# Patient Record
Sex: Female | Born: 1960 | Race: White | Hispanic: No
Health system: Southern US, Community
[De-identification: ages and names within clinical notes are randomized; demographics above are authoritative.]

## PROBLEM LIST (undated history)

## (undated) DIAGNOSIS — E119 Type 2 diabetes mellitus without complications: Secondary | ICD-10-CM

## (undated) DIAGNOSIS — I10 Essential (primary) hypertension: Secondary | ICD-10-CM

## (undated) DIAGNOSIS — J3489 Other specified disorders of nose and nasal sinuses: Secondary | ICD-10-CM

## (undated) DIAGNOSIS — R51 Headache: Secondary | ICD-10-CM

## (undated) DIAGNOSIS — M1990 Unspecified osteoarthritis, unspecified site: Secondary | ICD-10-CM

## (undated) DIAGNOSIS — E7800 Pure hypercholesterolemia, unspecified: Secondary | ICD-10-CM

## (undated) DIAGNOSIS — F32A Depression, unspecified: Secondary | ICD-10-CM

## (undated) HISTORY — DX: Depression, unspecified: F32.A

## (undated) HISTORY — PX: CARPAL TUNNEL RELEASE: SHX101

## (undated) HISTORY — PX: TONSILLECTOMY: SUR1361

## (undated) HISTORY — PX: BREAST SURGERY: SHX581

## (undated) HISTORY — DX: Pure hypercholesterolemia, unspecified: E78.00

## (undated) HISTORY — PX: TUBAL LIGATION: SHX77

## (undated) HISTORY — PX: FOOT SURGERY: SHX648

## (undated) HISTORY — PX: WISDOM TOOTH EXTRACTION: SHX21

---

## 1997-07-19 ENCOUNTER — Ambulatory Visit (HOSPITAL_COMMUNITY): Admission: RE | Admit: 1997-07-19 | Discharge: 1997-07-19 | Payer: Self-pay | Admitting: *Deleted

## 1997-07-27 ENCOUNTER — Ambulatory Visit (HOSPITAL_BASED_OUTPATIENT_CLINIC_OR_DEPARTMENT_OTHER): Admission: RE | Admit: 1997-07-27 | Discharge: 1997-07-27 | Payer: Self-pay | Admitting: *Deleted

## 1997-11-01 ENCOUNTER — Ambulatory Visit (HOSPITAL_COMMUNITY): Admission: RE | Admit: 1997-11-01 | Discharge: 1997-11-01 | Payer: Self-pay | Admitting: Orthopedic Surgery

## 2000-07-10 ENCOUNTER — Other Ambulatory Visit: Admission: RE | Admit: 2000-07-10 | Discharge: 2000-07-10 | Payer: Self-pay | Admitting: Internal Medicine

## 2000-07-14 ENCOUNTER — Encounter: Payer: Self-pay | Admitting: Internal Medicine

## 2000-07-14 ENCOUNTER — Encounter: Admission: RE | Admit: 2000-07-14 | Discharge: 2000-07-14 | Payer: Self-pay | Admitting: Internal Medicine

## 2004-11-12 ENCOUNTER — Encounter: Admission: RE | Admit: 2004-11-12 | Discharge: 2004-11-12 | Payer: Self-pay | Admitting: Internal Medicine

## 2010-05-06 ENCOUNTER — Encounter: Payer: Self-pay | Admitting: Family Medicine

## 2011-09-10 ENCOUNTER — Other Ambulatory Visit: Payer: Self-pay | Admitting: Family Medicine

## 2011-09-10 DIAGNOSIS — Z1231 Encounter for screening mammogram for malignant neoplasm of breast: Secondary | ICD-10-CM

## 2011-09-24 ENCOUNTER — Ambulatory Visit
Admission: RE | Admit: 2011-09-24 | Discharge: 2011-09-24 | Disposition: A | Discharge status: Home / Self Care | Payer: BC Managed Care – PPO | Source: Ambulatory Visit | Attending: Family Medicine | Admitting: Family Medicine

## 2011-09-24 DIAGNOSIS — Z1231 Encounter for screening mammogram for malignant neoplasm of breast: Secondary | ICD-10-CM

## 2013-02-11 ENCOUNTER — Other Ambulatory Visit: Payer: Self-pay | Admitting: Family Medicine

## 2013-02-11 ENCOUNTER — Ambulatory Visit
Admission: RE | Admit: 2013-02-11 | Discharge: 2013-02-11 | Disposition: A | Discharge status: Home / Self Care | Payer: BC Managed Care – PPO | Source: Ambulatory Visit | Attending: Family Medicine | Admitting: Family Medicine

## 2013-02-11 DIAGNOSIS — R52 Pain, unspecified: Secondary | ICD-10-CM

## 2013-09-21 ENCOUNTER — Other Ambulatory Visit: Payer: Self-pay | Admitting: Orthopedic Surgery

## 2013-09-23 ENCOUNTER — Encounter (HOSPITAL_COMMUNITY): Payer: Self-pay | Admitting: Pharmacy Technician

## 2013-09-28 ENCOUNTER — Encounter (HOSPITAL_COMMUNITY): Payer: Self-pay

## 2013-09-28 ENCOUNTER — Ambulatory Visit (HOSPITAL_COMMUNITY)
Admission: RE | Admit: 2013-09-28 | Discharge: 2013-09-28 | Disposition: A | Discharge status: Home / Self Care | Payer: Worker's Compensation | Source: Ambulatory Visit | Attending: Orthopedic Surgery | Admitting: Orthopedic Surgery

## 2013-09-28 ENCOUNTER — Encounter (HOSPITAL_COMMUNITY)
Admission: RE | Admit: 2013-09-28 | Discharge: 2013-09-28 | Disposition: A | Discharge status: Home / Self Care | Payer: Worker's Compensation | Source: Ambulatory Visit | Attending: Orthopedic Surgery | Admitting: Orthopedic Surgery

## 2013-09-28 DIAGNOSIS — Z01818 Encounter for other preprocedural examination: Secondary | ICD-10-CM | POA: Insufficient documentation

## 2013-09-28 DIAGNOSIS — Z01812 Encounter for preprocedural laboratory examination: Secondary | ICD-10-CM | POA: Insufficient documentation

## 2013-09-28 DIAGNOSIS — Z01810 Encounter for preprocedural cardiovascular examination: Secondary | ICD-10-CM | POA: Insufficient documentation

## 2013-09-28 HISTORY — DX: Unspecified osteoarthritis, unspecified site: M19.90

## 2013-09-28 HISTORY — DX: Headache: R51

## 2013-09-28 HISTORY — DX: Other specified disorders of nose and nasal sinuses: J34.89

## 2013-09-28 HISTORY — DX: Essential (primary) hypertension: I10

## 2013-09-28 HISTORY — DX: Type 2 diabetes mellitus without complications: E11.9

## 2013-09-28 LAB — URINALYSIS, ROUTINE W REFLEX MICROSCOPIC
BILIRUBIN URINE: NEGATIVE
GLUCOSE, UA: NEGATIVE mg/dL
HGB URINE DIPSTICK: NEGATIVE
Ketones, ur: 15 mg/dL — AB
Leukocytes, UA: NEGATIVE
Nitrite: NEGATIVE
PROTEIN: NEGATIVE mg/dL
Specific Gravity, Urine: 1.022 (ref 1.005–1.030)
UROBILINOGEN UA: 0.2 mg/dL (ref 0.0–1.0)
pH: 6.5 (ref 5.0–8.0)

## 2013-09-28 LAB — COMPREHENSIVE METABOLIC PANEL
ALT: 22 U/L (ref 0–35)
AST: 13 U/L (ref 0–37)
Albumin: 4.2 g/dL (ref 3.5–5.2)
Alkaline Phosphatase: 44 U/L (ref 39–117)
BUN: 19 mg/dL (ref 6–23)
CHLORIDE: 102 meq/L (ref 96–112)
CO2: 29 meq/L (ref 19–32)
Calcium: 9.6 mg/dL (ref 8.4–10.5)
Creatinine, Ser: 0.64 mg/dL (ref 0.50–1.10)
GFR calc Af Amer: >90 mL/min (ref >90)
Glucose, Bld: 167 mg/dL — ABNORMAL HIGH (ref 70–99)
Potassium: 4.2 mEq/L (ref 3.7–5.3)
SODIUM: 142 meq/L (ref 137–147)
Total Bilirubin: 0.4 mg/dL (ref 0.3–1.2)
Total Protein: 7.3 g/dL (ref 6.0–8.3)

## 2013-09-28 LAB — CBC WITH DIFFERENTIAL/PLATELET
Basophils Absolute: 0 10*3/uL (ref 0.0–0.1)
Basophils Relative: 0 % (ref 0–1)
Eosinophils Absolute: 0 10*3/uL (ref 0.0–0.7)
Eosinophils Relative: 1 % (ref 0–5)
HEMATOCRIT: 40.2 % (ref 36.0–46.0)
Hemoglobin: 13.4 g/dL (ref 12.0–15.0)
LYMPHS PCT: 32 % (ref 12–46)
Lymphs Abs: 2.5 10*3/uL (ref 0.7–4.0)
MCH: 29.6 pg (ref 26.0–34.0)
MCHC: 33.3 g/dL (ref 30.0–36.0)
MCV: 88.9 fL (ref 78.0–100.0)
MONO ABS: 0.3 10*3/uL (ref 0.1–1.0)
Monocytes Relative: 4 % (ref 3–12)
NEUTROS ABS: 4.9 10*3/uL (ref 1.7–7.7)
Neutrophils Relative %: 63 % (ref 43–77)
PLATELETS: 230 10*3/uL (ref 150–400)
RBC: 4.52 MIL/uL (ref 3.87–5.11)
RDW: 13.1 % (ref 11.5–15.5)
WBC: 7.8 10*3/uL (ref 4.0–10.5)

## 2013-09-28 LAB — TYPE AND SCREEN
ABO/RH(D): A POS
ANTIBODY SCREEN: NEGATIVE

## 2013-09-28 LAB — PROTIME-INR
INR: 0.97 (ref 0.00–1.49)
Prothrombin Time: 12.7 seconds (ref 11.6–15.2)

## 2013-09-28 LAB — APTT: APTT: 29 s (ref 24–37)

## 2013-09-28 LAB — ABO/RH: ABO/RH(D): A POS

## 2013-09-28 LAB — SURGICAL PCR SCREEN
MRSA, PCR: NEGATIVE
Staphylococcus aureus: NEGATIVE

## 2013-09-28 NOTE — Pre-Procedure Instructions (Signed)
Shaliah Gus HeightW Shanker  09/28/2013   Your procedure is scheduled on:  Thursday June 18 th   [facility] at 1015 AM.  Call this number if you have problems the morning of surgery: [phone]   Remember:   Do not eat food or drink liquids after midnight.   Take these medicines the morning of surgery with A SIP OF WATER: None Stop Herbal medications , Nsaids , Aspirin and vitamins 5 days prior to surgery.   Do not wear jewelry, make-up or nail polish.  Do not wear lotions, powders, or perfumes. You may wear deodorant.  Do not shave 48 hours prior to surgery.   Do not bring valuables to the hospital.  [facility] any belongings or valuables.               Contacts, dentures or bridgework may not be worn into surgery.  Leave suitcase in the car. After surgery it may be brought to your room.  For patients admitted to the hospital, discharge time is determined by your treatment team.               Patients discharged the day of surgery will not be allowed to drive home.    Special Instructions: [place] - Preparing for Surgery  Before surgery, you can play an important role.  Because skin is not sterile, your skin needs to be as free of germs as possible.  You can reduce the number of germs on you skin by washing with CHG (chlorahexidine gluconate) soap before surgery.  CHG is an antiseptic cleaner which kills germs and bonds with the skin to continue killing germs even after washing.  Please DO NOT use if you have an allergy to CHG or antibacterial soaps.  If your skin becomes reddened/irritated stop using the CHG and inform your nurse when you arrive at Short Stay.  Do not shave (including legs and underarms) for at least 48 hours prior to the first CHG shower.  You may shave your face.  Please follow these instructions carefully:   1.  Shower with CHG Soap the night before surgery and the                                morning of  Surgery.  2.  If you choose to wash your hair, wash your hair first as usual with your       normal shampoo.  3.  After you shampoo, rinse your hair and body thoroughly to remove the                      Shampoo.  4.  Use CHG as you would any other liquid soap.  You can apply chg directly       to the skin and wash gently with scrungie or a clean washcloth.  5.  Apply the CHG Soap to your body ONLY FROM THE NECK DOWN.        Do not use on open wounds or open sores.  Avoid contact with your eyes,       ears, mouth and genitals (private parts).  Wash genitals (private parts)       with your normal soap.  6.  Wash thoroughly, paying special attention to the area where your surgery        will be performed.  7.  Thoroughly rinse your body with warm water from the neck down.  8.  DO NOT shower/wash with your normal soap after using and rinsing off       the CHG Soap.  9.  Pat yourself dry with a clean towel.            10.  Wear clean pajamas.            11.  Place clean sheets on your bed the night of your first shower and do not        sleep with pets.  Day of Surgery  Do not apply any lotions/deoderants the morning of surgery.  Please wear clean clothes to the hospital/surgery center.      Please read over the following fact sheets that you were given: Pain Booklet, Coughing and Deep Breathing, Blood Transfusion Information, MRSA Information and Surgical Site Infection Prevention

## 2013-09-29 MED ORDER — CEFAZOLIN SODIUM-DEXTROSE 2-3 GM-% IV SOLR
2.0000 g | INTRAVENOUS | Status: AC
  Administered 2013-09-30: 2 g via INTRAVENOUS
  Filled 2013-09-29: qty 50

## 2013-09-29 MED ORDER — POVIDONE-IODINE 7.5 % EX SOLN
Freq: Once | CUTANEOUS | Status: DC
  Filled 2013-09-29: qty 118

## 2013-09-29 NOTE — H&P (Signed)
     PREOPERATIVE H&P  Chief Complaint: right arm pain  HPI: Erica Turner is a 53 y.o. female who presents with ongoing pain in the right arm  MRI reveals a foraminal disc herniation on the right at C6/7  Patient has failed multiple forms of conservative care and continues to have pain (see office notes for additional details regarding the patient's full course of treatment)  Past Medical History  Diagnosis Date  . Diabetes mellitus without complication     takes  cinnamion and eat lots of proteins   . Hypertension     on meds  . Sinus drainage   . Headache(784.0)     migraines  . Arthritis     neck and hip   Past Surgical History  Procedure Laterality Date  . Tonsillectomy    . Carpal tunnel release Bilateral   . Breast surgery Right     lumpectomy  . Tubal ligation     History   Social History  . Marital Status: Married    Spouse Name: N/A    Number of Children: N/A  . Years of Education: N/A   Social History Main Topics  . Smoking status: Never Smoker   . Smokeless tobacco: Never Used  . Alcohol Use: Yes     Comment: wine rarely  . Drug Use: No  . Sexual Activity: Not on file   Other Topics Concern  . Not on file   Social History Narrative  . No narrative on file   No family history on file. No Known Allergies Prior to Admission medications   Medication Sig Start Date End Date Taking? Authorizing Provider  acetaminophen (TYLENOL) 500 MG tablet Take 1,000 mg by mouth every 8 (eight) hours as needed (pain).   Yes Historical Provider, MD  CINNAMON PO Take 1 tablet by mouth 2 (two) times daily.   Yes Historical Provider, MD  lisinopril (PRINIVIL,ZESTRIL) 10 MG tablet Take 10 mg by mouth daily.   Yes Historical Provider, MD  rosuvastatin (CRESTOR) 20 MG tablet Take 20 mg by mouth daily.   Yes Historical Provider, MD     All other systems have been reviewed and were otherwise negative with the exception of those mentioned in the HPI and as  above.  Physical Exam: There were no vitals filed for this visit.  General: Alert, no acute distress Cardiovascular: No pedal edema Respiratory: No cyanosis, no use of accessory musculature Skin: No lesions in the area of chief complaint Neurologic: Sensation intact distally Psychiatric: Patient is competent for consent with normal mood and affect Lymphatic: No axillary or cervical lymphadenopathy  MUSCULOSKELETAL: + spurling's on right  Assessment/Plan: Right arm pain Plan for Procedure(s): ANTERIOR CERVICAL DECOMPRESSION/FUSION C6/7   Emilee HeroUMONSKI,MARK LEONARD, MD 09/29/2013 2:19 PM

## 2013-09-30 ENCOUNTER — Encounter (HOSPITAL_COMMUNITY): Payer: Self-pay | Admitting: *Deleted

## 2013-09-30 ENCOUNTER — Encounter (HOSPITAL_COMMUNITY)
Admission: RE | Disposition: A | Discharge status: Home / Self Care | Payer: Self-pay | Source: Ambulatory Visit | Attending: Orthopedic Surgery

## 2013-09-30 ENCOUNTER — Ambulatory Visit (HOSPITAL_COMMUNITY): Payer: Worker's Compensation | Admitting: Anesthesiology

## 2013-09-30 ENCOUNTER — Encounter (HOSPITAL_COMMUNITY): Payer: Worker's Compensation | Admitting: Anesthesiology

## 2013-09-30 ENCOUNTER — Ambulatory Visit (HOSPITAL_COMMUNITY): Payer: Worker's Compensation

## 2013-09-30 ENCOUNTER — Observation Stay (HOSPITAL_COMMUNITY)
Admission: RE | Admit: 2013-09-30 | Discharge: 2013-10-01 | Disposition: A | Discharge status: Home / Self Care | Payer: Worker's Compensation | Source: Ambulatory Visit | Attending: Orthopedic Surgery | Admitting: Orthopedic Surgery

## 2013-09-30 DIAGNOSIS — M5410 Radiculopathy, site unspecified: Secondary | ICD-10-CM | POA: Diagnosis present

## 2013-09-30 DIAGNOSIS — M1610 Unilateral primary osteoarthritis, unspecified hip: Secondary | ICD-10-CM | POA: Insufficient documentation

## 2013-09-30 DIAGNOSIS — G43909 Migraine, unspecified, not intractable, without status migrainosus: Secondary | ICD-10-CM | POA: Insufficient documentation

## 2013-09-30 DIAGNOSIS — I10 Essential (primary) hypertension: Secondary | ICD-10-CM | POA: Insufficient documentation

## 2013-09-30 DIAGNOSIS — E119 Type 2 diabetes mellitus without complications: Secondary | ICD-10-CM | POA: Insufficient documentation

## 2013-09-30 DIAGNOSIS — M5020 Other cervical disc displacement, unspecified cervical region: Principal | ICD-10-CM | POA: Insufficient documentation

## 2013-09-30 HISTORY — PX: ANTERIOR CERVICAL DECOMP/DISCECTOMY FUSION: SHX1161

## 2013-09-30 LAB — GLUCOSE, CAPILLARY
GLUCOSE-CAPILLARY: 121 mg/dL — AB (ref 70–99)
Glucose-Capillary: 101 mg/dL — ABNORMAL HIGH (ref 70–99)
Glucose-Capillary: 106 mg/dL — ABNORMAL HIGH (ref 70–99)

## 2013-09-30 SURGERY — ANTERIOR CERVICAL DECOMPRESSION/DISCECTOMY FUSION 1 LEVEL
Anesthesia: General | Site: Spine Cervical

## 2013-09-30 MED ORDER — THROMBIN 20000 UNITS EX SOLR
CUTANEOUS | Status: DC | PRN
  Administered 2013-09-30: 15:00:00 via TOPICAL

## 2013-09-30 MED ORDER — OXYCODONE-ACETAMINOPHEN 5-325 MG PO TABS: 1.0000 | ORAL_TABLET | ORAL | Status: DC | PRN

## 2013-09-30 MED ORDER — ONDANSETRON HCL 4 MG/2ML IJ SOLN: 4.0000 mg | INTRAMUSCULAR | Status: DC | PRN

## 2013-09-30 MED ORDER — BUPIVACAINE-EPINEPHRINE (PF) 0.25% -1:200000 IJ SOLN
INTRAMUSCULAR | Status: AC
  Filled 2013-09-30: qty 30

## 2013-09-30 MED ORDER — DOCUSATE SODIUM 100 MG PO CAPS
100.0000 mg | ORAL_CAPSULE | Freq: Two times a day (BID) | ORAL | Status: DC
  Administered 2013-09-30: 100 mg via ORAL
  Filled 2013-09-30 (×3): qty 1

## 2013-09-30 MED ORDER — HYDROMORPHONE HCL PF 1 MG/ML IJ SOLN
INTRAMUSCULAR | Status: AC
  Administered 2013-09-30: 0.5 mg via INTRAVENOUS
  Filled 2013-09-30: qty 1

## 2013-09-30 MED ORDER — HYDROMORPHONE HCL PF 1 MG/ML IJ SOLN
0.2500 mg | INTRAMUSCULAR | Status: DC | PRN
  Administered 2013-09-30 (×2): 0.5 mg via INTRAVENOUS

## 2013-09-30 MED ORDER — SODIUM CHLORIDE 0.9 % IJ SOLN: 3.0000 mL | INTRAMUSCULAR | Status: DC | PRN

## 2013-09-30 MED ORDER — CEFAZOLIN SODIUM 1-5 GM-% IV SOLN
1.0000 g | Freq: Three times a day (TID) | INTRAVENOUS | Status: AC
  Administered 2013-09-30 – 2013-10-01 (×2): 1 g via INTRAVENOUS
  Filled 2013-09-30 (×2): qty 50

## 2013-09-30 MED ORDER — SENNOSIDES-DOCUSATE SODIUM 8.6-50 MG PO TABS
1.0000 | ORAL_TABLET | Freq: Every evening | ORAL | Status: DC | PRN
  Filled 2013-09-30: qty 1

## 2013-09-30 MED ORDER — ROCURONIUM BROMIDE 100 MG/10ML IV SOLN
INTRAVENOUS | Status: DC | PRN
  Administered 2013-09-30: 50 mg via INTRAVENOUS
  Administered 2013-09-30: 10 mg via INTRAVENOUS

## 2013-09-30 MED ORDER — FLEET ENEMA 7-19 GM/118ML RE ENEM
1.0000 | ENEMA | Freq: Once | RECTAL | Status: AC | PRN
  Filled 2013-09-30: qty 1

## 2013-09-30 MED ORDER — OXYCODONE HCL 5 MG PO TABS: 5.0000 mg | ORAL_TABLET | Freq: Once | ORAL | Status: DC | PRN

## 2013-09-30 MED ORDER — BUPIVACAINE-EPINEPHRINE 0.25% -1:200000 IJ SOLN
INTRAMUSCULAR | Status: DC | PRN
  Administered 2013-09-30: 2 mL

## 2013-09-30 MED ORDER — SUFENTANIL CITRATE 50 MCG/ML IV SOLN
INTRAVENOUS | Status: DC | PRN
  Administered 2013-09-30: 10 ug via INTRAVENOUS
  Administered 2013-09-30: 20 ug via INTRAVENOUS

## 2013-09-30 MED ORDER — MIDAZOLAM HCL 5 MG/5ML IJ SOLN
INTRAMUSCULAR | Status: DC | PRN
  Administered 2013-09-30: 2 mg via INTRAVENOUS

## 2013-09-30 MED ORDER — MORPHINE SULFATE 2 MG/ML IJ SOLN
1.0000 mg | INTRAMUSCULAR | Status: DC | PRN
  Administered 2013-09-30 – 2013-10-01 (×2): 2 mg via INTRAVENOUS
  Filled 2013-09-30 (×2): qty 1

## 2013-09-30 MED ORDER — DIAZEPAM 5 MG PO TABS: 5.0000 mg | ORAL_TABLET | Freq: Four times a day (QID) | ORAL | Status: DC | PRN

## 2013-09-30 MED ORDER — 0.9 % SODIUM CHLORIDE (POUR BTL) OPTIME
TOPICAL | Status: DC | PRN
  Administered 2013-09-30: 1000 mL

## 2013-09-30 MED ORDER — LIDOCAINE HCL (CARDIAC) 20 MG/ML IV SOLN
INTRAVENOUS | Status: DC | PRN
  Administered 2013-09-30: 40 mg via INTRAVENOUS

## 2013-09-30 MED ORDER — ACETAMINOPHEN 325 MG PO TABS
650.0000 mg | ORAL_TABLET | ORAL | Status: DC | PRN
Start: 2013-09-30 — End: 2013-10-01

## 2013-09-30 MED ORDER — ACETAMINOPHEN 650 MG RE SUPP: 650.0000 mg | RECTAL | Status: DC | PRN

## 2013-09-30 MED ORDER — BISACODYL 5 MG PO TBEC
5.0000 mg | DELAYED_RELEASE_TABLET | Freq: Every day | ORAL | Status: DC | PRN
  Filled 2013-09-30: qty 1

## 2013-09-30 MED ORDER — NEOSTIGMINE METHYLSULFATE 10 MG/10ML IV SOLN
INTRAVENOUS | Status: DC | PRN
  Administered 2013-09-30: 4 mg via INTRAVENOUS

## 2013-09-30 MED ORDER — ONDANSETRON HCL 4 MG/2ML IJ SOLN
INTRAMUSCULAR | Status: DC | PRN
  Administered 2013-09-30: 4 mg via INTRAVENOUS

## 2013-09-30 MED ORDER — THROMBIN 20000 UNITS EX SOLR
CUTANEOUS | Status: AC
  Filled 2013-09-30: qty 20000

## 2013-09-30 MED ORDER — EPHEDRINE SULFATE 50 MG/ML IJ SOLN
INTRAMUSCULAR | Status: DC | PRN
  Administered 2013-09-30: 10 mg via INTRAVENOUS

## 2013-09-30 MED ORDER — PROPOFOL 10 MG/ML IV BOLUS
INTRAVENOUS | Status: AC
  Filled 2013-09-30: qty 20

## 2013-09-30 MED ORDER — GLYCOPYRROLATE 0.2 MG/ML IJ SOLN
INTRAMUSCULAR | Status: DC | PRN
  Administered 2013-09-30: .7 mg via INTRAVENOUS

## 2013-09-30 MED ORDER — METOCLOPRAMIDE HCL 5 MG/ML IJ SOLN: 10.0000 mg | Freq: Once | INTRAMUSCULAR | Status: DC | PRN

## 2013-09-30 MED ORDER — SODIUM CHLORIDE 0.9 % IV SOLN: 250.0000 mL | INTRAVENOUS | Status: DC

## 2013-09-30 MED ORDER — OXYCODONE HCL 5 MG/5ML PO SOLN: 5.0000 mg | Freq: Once | ORAL | Status: DC | PRN

## 2013-09-30 MED ORDER — LACTATED RINGERS IV SOLN
INTRAVENOUS | Status: DC | PRN
  Administered 2013-09-30 (×2): via INTRAVENOUS

## 2013-09-30 MED ORDER — SUFENTANIL CITRATE 50 MCG/ML IV SOLN
INTRAVENOUS | Status: AC
  Filled 2013-09-30: qty 1

## 2013-09-30 MED ORDER — MENTHOL 3 MG MT LOZG
1.0000 | LOZENGE | OROMUCOSAL | Status: DC | PRN
  Administered 2013-10-01: 3 mg via ORAL
  Filled 2013-09-30: qty 9

## 2013-09-30 MED ORDER — ATORVASTATIN CALCIUM 40 MG PO TABS
40.0000 mg | ORAL_TABLET | Freq: Every day | ORAL | Status: DC
  Filled 2013-09-30: qty 1

## 2013-09-30 MED ORDER — PROPOFOL 10 MG/ML IV BOLUS
INTRAVENOUS | Status: DC | PRN
  Administered 2013-09-30: 150 mg via INTRAVENOUS

## 2013-09-30 MED ORDER — ALUM & MAG HYDROXIDE-SIMETH 200-200-20 MG/5ML PO SUSP: 30.0000 mL | Freq: Four times a day (QID) | ORAL | Status: DC | PRN

## 2013-09-30 MED ORDER — LACTATED RINGERS IV SOLN: INTRAVENOUS | Status: DC

## 2013-09-30 MED ORDER — SODIUM CHLORIDE 0.9 % IJ SOLN
3.0000 mL | Freq: Two times a day (BID) | INTRAMUSCULAR | Status: DC
  Administered 2013-09-30: 3 mL via INTRAVENOUS

## 2013-09-30 MED ORDER — LISINOPRIL 10 MG PO TABS
10.0000 mg | ORAL_TABLET | Freq: Every day | ORAL | Status: DC
  Administered 2013-09-30: 10 mg via ORAL
  Filled 2013-09-30 (×2): qty 1

## 2013-09-30 MED ORDER — HYDROMORPHONE HCL PF 1 MG/ML IJ SOLN
INTRAMUSCULAR | Status: AC
  Filled 2013-09-30: qty 1

## 2013-09-30 MED ORDER — MIDAZOLAM HCL 2 MG/2ML IJ SOLN
INTRAMUSCULAR | Status: AC
  Filled 2013-09-30: qty 2

## 2013-09-30 MED ORDER — PHENOL 1.4 % MT LIQD: 1.0000 | OROMUCOSAL | Status: DC | PRN

## 2013-09-30 SURGICAL SUPPLY — 74 items
BENZOIN TINCTURE PRP APPL 2/3 (GAUZE/BANDAGES/DRESSINGS) ×2 IMPLANT
BIT DRILL NEURO 2X3.1 SFT TUCH (MISCELLANEOUS) ×1 IMPLANT
BIT DRILL SKYLINE 12MM (BIT) ×1 IMPLANT
BLADE SURG 15 STRL LF DISP TIS (BLADE) ×1 IMPLANT
BLADE SURG 15 STRL SS (BLADE) ×1
BLADE SURG ROTATE 9660 (MISCELLANEOUS) ×2 IMPLANT
BUR MATCHSTICK NEURO 3.0 LAGG (BURR) IMPLANT
CANISTER SUCT 3000ML (MISCELLANEOUS) ×2 IMPLANT
CARTRIDGE OIL MAESTRO DRILL (MISCELLANEOUS) ×1 IMPLANT
CLSR STERI-STRIP ANTIMIC 1/2X4 (GAUZE/BANDAGES/DRESSINGS) ×2 IMPLANT
COLLAR CERV LO CONTOUR FIRM DE (SOFTGOODS) ×2 IMPLANT
CORDS BIPOLAR (ELECTRODE) ×2 IMPLANT
COVER SURGICAL LIGHT HANDLE (MISCELLANEOUS) ×2 IMPLANT
CRADLE DONUT ADULT HEAD (MISCELLANEOUS) ×2 IMPLANT
DIFFUSER DRILL AIR PNEUMATIC (MISCELLANEOUS) ×2 IMPLANT
DRAIN JACKSON RD 7FR 3/32 (WOUND CARE) IMPLANT
DRAPE C-ARM 42X72 X-RAY (DRAPES) ×2 IMPLANT
DRAPE POUCH INSTRU U-SHP 10X18 (DRAPES) ×2 IMPLANT
DRAPE SURG 17X23 STRL (DRAPES) ×6 IMPLANT
DRILL BIT SKYLINE 12MM (BIT) ×1
DRILL NEURO 2X3.1 SOFT TOUCH (MISCELLANEOUS) ×2
DURAPREP 26ML APPLICATOR (WOUND CARE) ×2 IMPLANT
ELECT COATED BLADE 2.86 ST (ELECTRODE) ×2 IMPLANT
ELECT REM PT RETURN 9FT ADLT (ELECTROSURGICAL) ×2
ELECTRODE REM PT RTRN 9FT ADLT (ELECTROSURGICAL) ×1 IMPLANT
EVACUATOR SILICONE 100CC (DRAIN) IMPLANT
GAUZE SPONGE 4X4 16PLY XRAY LF (GAUZE/BANDAGES/DRESSINGS) ×2 IMPLANT
GLOVE BIO SURGEON STRL SZ7 (GLOVE) ×2 IMPLANT
GLOVE BIO SURGEON STRL SZ8 (GLOVE) ×2 IMPLANT
GLOVE BIOGEL PI IND STRL 7.0 (GLOVE) ×2 IMPLANT
GLOVE BIOGEL PI IND STRL 8 (GLOVE) ×1 IMPLANT
GLOVE BIOGEL PI INDICATOR 7.0 (GLOVE) ×2
GLOVE BIOGEL PI INDICATOR 8 (GLOVE) ×1
GOWN STRL REUS W/ TWL LRG LVL3 (GOWN DISPOSABLE) ×1 IMPLANT
GOWN STRL REUS W/ TWL XL LVL3 (GOWN DISPOSABLE) ×1 IMPLANT
GOWN STRL REUS W/TWL LRG LVL3 (GOWN DISPOSABLE) ×1
GOWN STRL REUS W/TWL XL LVL3 (GOWN DISPOSABLE) ×1
IMPL S ENDOSKEL TC 7 ODEG (Orthopedic Implant) ×1 IMPLANT
IMPLANT S ENDOSKEL TC 7 ODEG (Orthopedic Implant) ×2 IMPLANT
IV CATH 14GX2 1/4 (CATHETERS) ×2 IMPLANT
KIT BASIN OR (CUSTOM PROCEDURE TRAY) ×2 IMPLANT
KIT ROOM TURNOVER OR (KITS) ×2 IMPLANT
MANIFOLD NEPTUNE II (INSTRUMENTS) IMPLANT
NEEDLE 27GAX1X1/2 (NEEDLE) ×2 IMPLANT
NEEDLE SPNL 20GX3.5 QUINCKE YW (NEEDLE) ×2 IMPLANT
NS IRRIG 1000ML POUR BTL (IV SOLUTION) ×2 IMPLANT
OIL CARTRIDGE MAESTRO DRILL (MISCELLANEOUS) ×2
PACK ORTHO CERVICAL (CUSTOM PROCEDURE TRAY) ×2 IMPLANT
PAD ARMBOARD 7.5X6 YLW CONV (MISCELLANEOUS) ×4 IMPLANT
PATTIES SURGICAL .5 X.5 (GAUZE/BANDAGES/DRESSINGS) IMPLANT
PATTIES SURGICAL .5 X1 (DISPOSABLE) IMPLANT
PIN DISTRACTION 14 (PIN) ×4 IMPLANT
PLATE SKYLINE 12MM (Plate) ×2 IMPLANT
PUTTY BONE DBX 2.5 MIS (Bone Implant) ×2 IMPLANT
SCREW VAR SELF TAP SKYLINE 14M (Screw) ×8 IMPLANT
SPONGE GAUZE 4X4 12PLY (GAUZE/BANDAGES/DRESSINGS) ×2 IMPLANT
SPONGE GAUZE 4X4 12PLY STER LF (GAUZE/BANDAGES/DRESSINGS) ×2 IMPLANT
SPONGE INTESTINAL PEANUT (DISPOSABLE) ×2 IMPLANT
SPONGE SURGIFOAM ABS GEL 100 (HEMOSTASIS) ×2 IMPLANT
STRIP CLOSURE SKIN 1/2X4 (GAUZE/BANDAGES/DRESSINGS) ×2 IMPLANT
SURGIFLO TRUKIT (HEMOSTASIS) IMPLANT
SUT MNCRL AB 4-0 PS2 18 (SUTURE) IMPLANT
SUT SILK 4 0 (SUTURE)
SUT SILK 4-0 18XBRD TIE 12 (SUTURE) IMPLANT
SUT VIC AB 2-0 CT2 18 VCP726D (SUTURE) ×2 IMPLANT
SYR BULB IRRIGATION 50ML (SYRINGE) ×2 IMPLANT
SYR CONTROL 10ML LL (SYRINGE) ×4 IMPLANT
TAPE CLOTH 4X10 WHT NS (GAUZE/BANDAGES/DRESSINGS) IMPLANT
TAPE CLOTH SURG 4X10 WHT LF (GAUZE/BANDAGES/DRESSINGS) ×2 IMPLANT
TAPE UMBILICAL COTTON 1/8X30 (MISCELLANEOUS) ×2 IMPLANT
TOWEL OR 17X24 6PK STRL BLUE (TOWEL DISPOSABLE) ×2 IMPLANT
TOWEL OR 17X26 10 PK STRL BLUE (TOWEL DISPOSABLE) ×2 IMPLANT
WATER STERILE IRR 1000ML POUR (IV SOLUTION) IMPLANT
YANKAUER SUCT BULB TIP NO VENT (SUCTIONS) ×2 IMPLANT

## 2013-09-30 NOTE — Anesthesia Postprocedure Evaluation (Signed)
  Anesthesia Post-op Note  Patient: Erica BealDanette W Turner  Procedure(s) Performed: Procedure(s) with comments: ANTERIOR CERVICAL DECOMPRESSION/DISCECTOMY FUSION 1 LEVEL (N/A) - Anterior cervical decompression fusion, cervical 6-7 with instrumentation and allograft  Patient Location: PACU  Anesthesia Type:General  Level of Consciousness: awake and alert   Airway and Oxygen Therapy: Patient Spontanous Breathing  Post-op Pain: mild  Post-op Assessment: Post-op Vital signs reviewed, Patient's Cardiovascular Status Stable, Respiratory Function Stable, Patent Airway and Pain level controlled  Post-op Vital Signs: Reviewed and stable  Last Vitals:  Filed Vitals:   09/30/13 1700  BP: 168/78  Pulse: 61  Temp:   Resp: 11    Complications: No apparent anesthesia complications

## 2013-09-30 NOTE — Anesthesia Procedure Notes (Signed)
Procedure Name: Intubation Date/Time: 09/30/2013 2:10 PM Performed by: Gwenyth AllegraADAMI, RICHARD Pre-anesthesia Checklist: Patient identified, Patient being monitored, Emergency Drugs available, Timeout performed and Suction available Patient Re-evaluated:Patient Re-evaluated prior to inductionOxygen Delivery Method: Circle system utilized Intubation Type: IV induction Ventilation: Mask ventilation without difficulty Laryngoscope Size: 3 and Mac Grade View: Grade I Tube type: Oral Number of attempts: 1 Airway Equipment and Method: Stylet Placement Confirmation: ETT inserted through vocal cords under direct vision and positive ETCO2 Secured at: 21 cm Tube secured with: Tape Dental Injury: Teeth and Oropharynx as per pre-operative assessment

## 2013-09-30 NOTE — Anesthesia Preprocedure Evaluation (Signed)
Anesthesia Evaluation  Patient identified by MRN, date of birth, ID band Patient awake    Reviewed: Allergy & Precautions, H&P , NPO status , Patient's Chart, lab work & pertinent test results, reviewed documented beta blocker date and time   Airway Mallampati: II TM Distance: >3 FB Neck ROM: full    Dental   Pulmonary neg pulmonary ROS,  breath sounds clear to auscultation        Cardiovascular hypertension, On Medications Rhythm:regular     Neuro/Psych  Headaches, negative psych ROS   GI/Hepatic negative GI ROS, Neg liver ROS,   Endo/Other  negative endocrine ROSdiabetes  Renal/GU negative Renal ROS  negative genitourinary   Musculoskeletal   Abdominal   Peds  Hematology negative hematology ROS (+)   Anesthesia Other Findings See surgeon's H&P   Reproductive/Obstetrics negative OB ROS                           Anesthesia Physical Anesthesia Plan  ASA: II  Anesthesia Plan: General   Post-op Pain Management:    Induction: Intravenous  Airway Management Planned: Oral ETT  Additional Equipment:   Intra-op Plan:   Post-operative Plan: Extubation in OR  Informed Consent: I have reviewed the patients History and Physical, chart, labs and discussed the procedure including the risks, benefits and alternatives for the proposed anesthesia with the patient or authorized representative who has indicated his/her understanding and acceptance.   Dental Advisory Given  Plan Discussed with: CRNA and Surgeon  Anesthesia Plan Comments:         Anesthesia Quick Evaluation

## 2013-09-30 NOTE — Transfer of Care (Signed)
Immediate Anesthesia Transfer of Care Note  Patient: Erica Turner  Procedure(s) Performed: Procedure(s) with comments: ANTERIOR CERVICAL DECOMPRESSION/DISCECTOMY FUSION 1 LEVEL (N/A) - Anterior cervical decompression fusion, cervical 6-7 with instrumentation and allograft  Patient Location: PACU  Anesthesia Type:General  Level of Consciousness: awake, alert  and oriented  Airway & Oxygen Therapy: Patient Spontanous Breathing and Patient connected to nasal cannula oxygen  Post-op Assessment: Report given to PACU RN and Post -op Vital signs reviewed and stable  Post vital signs: Reviewed and stable  Complications: No apparent anesthesia complications

## 2013-09-30 NOTE — Op Note (Signed)
NAMNickolas Madrid:  Janis, Jeffie              ACCOUNT NO.:  [id number]  MEDICAL RECORD NO.:  [id number]  LOCATION:  3C09C                        FACILITY:  [facility]  PHYSICIAN:  Estill BambergMark Dumonski, MD      DATE OF BIRTH:  1960/09/07  DATE OF PROCEDURE:  09/30/2013                              OPERATIVE REPORT   PREOPERATIVE DIAGNOSES: 1. Right C7 radiculopathy. 2. Right C6-7 disk herniation resulting in right C7 radiculopathy.  POSTOPERATIVE DIAGNOSES: 1. Right C7 radiculopathy. 2. Right C6-7 disk herniation resulting in right C7 radiculopathy.  PROCEDURE: 1. C6-7 anterior cervical decompression and fusion. 2. Placement of anterior instrumentation, C6-7. 3. Use of morselized allograft-DBX mix. 4. Intraoperative use of fluoroscopy. 5. Insertion of interbody device x1 (small, parallel, 7 mm tight and     interbody spacer).  SURGEON:  Estill BambergMark Dumonski, MD  ASSISTANT:  Jason CoopKayla McKenzie, PA-C  ANESTHESIA:  General endotracheal anesthesia.  COMPLICATIONS:  None.  DISPOSITION:  Stable.  ESTIMATED BLOOD LOSS:  Minimal.  INDICATIONS FOR PROCEDURE:  Briefly, Ms. Sherrie GeorgeDecker is a very pleasant 53- year-old female who did present to me on July 26, 2013, with severe pain in her right arm.  She is status post a work injury that did occur on March 21, 2013, when she was lifting a 50 pounds bag of potatoes. She went on to have significant pain in her neck and right arm.  An MRI did clearly reveal a C6-7 herniated disk fragment, clearly compressing the C7 nerve on the right.  The patient did fail conservative care, and we did discuss proceeding with the surgery reflected above.  The patient did fully understand the risks and limitations of the procedure as outlined in my preoperative note.  OPERATIVE DETAILS:  On September 30, 2013, the patient was brought to surgery and general endotracheal anesthesia was administered.  The patient was placed supine on a hospital bed.  All bony prominences were  meticulously padded.  The patient's arms were secured to her sides.  The neck was prepped and draped in the usual sterile fashion and a time-out procedure was performed.  I then made a left-sided transverse incision in line with the C6-7 interspace.  The platysma was sharply incised.  Clementeen GrahamSmith- Robinson approach was utilized.  The anterior spine was noted.  A lateral fluoroscopic view did confirm the appropriate operative level. I then subperiosteally exposed the vertebral bodies of C6 and C7.  A self-retaining Shadow-Line retractor was placed.  Caspar pins were placed into the C6 and C7 vertebral bodies and distraction was applied. I then used a 15-blade knife to perform an annulotomy anteriorly.  I then used a series of curettes to perform a thorough and complete diskectomy.  The posterior longitudinal ligament was identified and entered using a nerve hook.  I then used a series of Kerrison punches to remove the posterior longitudinal ligament.  A central and bilateral neural foraminal decompression was performed.  I did confirm adequate decompression of the neural foramen on the right side using a nerve hook.  The endplates were then prepared, and I did place a series of trials and I did ultimately select a 7 mm small parallel interbody spacer, which was packed with DBX  mix and tamped into position in the usual fashion.  The Caspar pins were then removed.  The bone wax was placed in their place.  I then applied a 12 mm plate over the anterior cervical spine.  A 14 mm variable angle screws were placed, 2 in each vertebral body at C6 and C7 for a total of 4 screws.  The CAM locking mechanism was utilized per the manufacture's recommendations.  I then copiously irrigated the wound.  I was very pleased with the AP and lateral fluoroscopic images.  The platysma was then closed using 2-0 Vicryl, and the skin was closed using 3-0 Monocryl.  Benzoin and Steri- Strips were applied, followed by a  sterile dressing.  All instrument counts were correct at the termination of the procedure.  Jason CoopKayla McKenzie was my assistant throughout surgery, and did aid in retraction, suctioning, and closure.     Estill BambergMark Dumonski, MD     MD/MEDQ  D:  09/30/2013  T:  09/30/2013  Job:  [id number]  cc:   Duncan Dullonna R. Gates, M.D.

## 2013-10-01 LAB — GLUCOSE, CAPILLARY: Glucose-Capillary: 138 mg/dL — ABNORMAL HIGH (ref 70–99)

## 2013-10-01 NOTE — Progress Notes (Signed)
Pt. Alert and oriented, follows simple instructions, denies pain. Incision area without swelling, redness or S/S of infection. Voiding adequate clear yellow urine. Moving all extremities well and vitals stable and documented. Anterior Cervical Fusion surgery notes instructions given to patient and family member for home safety and precautions. Patient discharged home with family. Pt. and family stated understanding of instructions given.   

## 2013-10-01 NOTE — Progress Notes (Signed)
    Patient doing well, reports resolution of R arm pain, some lingering intermittent numbness in R thumb. Moderate sore throat, felt as though she was having acid reflux this morning. Has been up and walking, eating breakfast now, expected mild neck pain and tightness    Physical Exam: Filed Vitals:   10/01/13 0807  BP: 115/64  Pulse: 68  Temp: 99 F (37.2 C)  Resp: 18    Dressing in place, soft collar worn appropriately, appears very comfortable sitting up in bed. NVI  POD #1 s/p C6-7 ACDF  - resolved R arm pain, minimal neck pain - Percocet for pain, Valium for muscle spasms, scripts signed in chart for D/C - likely d/c home today

## 2013-10-01 NOTE — Progress Notes (Signed)
UR Completed Brenda Graves-Bigelow, RN,BSN [phone]  

## 2013-10-03 ENCOUNTER — Encounter (HOSPITAL_COMMUNITY): Payer: Self-pay | Admitting: Orthopedic Surgery

## 2013-10-07 NOTE — Discharge Summary (Signed)
Patient ID: Erica Turner MRN: [id number] DOB/AGE: 55/07/1960 55 y.o.  Admit date: 09/30/2013 Discharge date: 10/01/2013  Admission Diagnoses:  Active Problems:   Radiculopathy   Discharge Diagnoses:  Same  Past Medical History  Diagnosis Date  . Hypertension     on meds  . Sinus drainage   . Headache(784.0)     migraines  . Arthritis     neck and hip  . Diabetes mellitus without complication     takes  cinnamion and eat lots of proteins     Surgeries: Procedure(s): ANTERIOR CERVICAL DECOMPRESSION/DISCECTOMY FUSION 1 LEVEL C6-7 on 09/30/2013   Consultants:  none  Discharged Condition: Improved  Hospital Course: Erica Turner is an 53 y.o. female who was admitted 09/30/2013 for operative treatment of right radiculopathy. Patient has severe unremitting pain that affects sleep, daily activities, and work/hobbies. After pre-op clearance the patient was taken to the operating room on 09/30/2013 and underwent  Procedure: C6-7 ACDF  Patient was given perioperative antibiotics:  Anti-infectives   Start     Dose/Rate Route Frequency Ordered Stop   09/30/13 2200  ceFAZolin (ANCEF) IVPB 1 g/50 mL premix     1 g 100 mL/hr over 30 Minutes Intravenous Every 8 hours 09/30/13 1938 10/01/13 0635   09/30/13 0600  ceFAZolin (ANCEF) IVPB 2 g/50 mL premix     2 g 100 mL/hr over 30 Minutes Intravenous On call to O.R. 09/29/13 1410 09/30/13 1355       Patient was given sequential compression devices, early ambulation to prevent DVT.  Patient benefited maximally from hospital stay and there were no complications.    Recent vital signs: BP 115/64  Pulse 68  Temp(Src) 99 F (37.2 C) (Oral)  Resp 18  Wt 69.485 kg (153 lb 3 oz)  SpO2 94%   Discharge Medications:     Medication List         acetaminophen 500 MG tablet  Commonly known as:  TYLENOL  Take 1,000 mg by mouth every 8 (eight) hours as needed (pain).     CINNAMON PO  Take 1 tablet by mouth 2 (two) times daily.      lisinopril 10 MG tablet  Commonly known as:  PRINIVIL,ZESTRIL  Take 10 mg by mouth daily.     rosuvastatin 20 MG tablet  Commonly known as:  CRESTOR  Take 20 mg by mouth daily.        Diagnostic Studies: Dg Chest 2 View  09/28/2013   CLINICAL DATA:  Preoperative evaluation for cervical spine surgery, history hypertension, diabetes  EXAM: CHEST  2 VIEW  COMPARISON:  None  FINDINGS: Upper normal heart size.  Normal mediastinal contours and pulmonary vascularity.  Lungs clear.  No pleural effusion or pneumothorax.  Bones unremarkable.  IMPRESSION: No acute abnormalities.   Electronically Signed   By: Ulyses SouthwardMark  Boles M.D.   On: 09/28/2013 15:59   Dg Cervical Spine 2-3 Views  10/01/2013   CLINICAL DATA:  Anterior cervical disc fusion.  EXAM: DG C-ARM 61-120 MIN; CERVICAL SPINE - 2-3 VIEW  COMPARISON:  MRI scan of April 21, 2013.  FINDINGS: Four intraoperative fluoroscopic images were obtained of the cervical spine. The first image demonstrates surgical probe directed toward the anterior portion of the C6-7 disc space. The second and third images demonstrate surgical needle directed toward the anterior portion of the C7 vertebral body. Fourth image demonstrates surgical probe directed into C6-7 disc space.  IMPRESSION: See above.   Electronically Signed  By: Roque LiasJames  Green M.D.   On: 10/01/2013 08:21   Dg C-arm 1-60 Min  10/01/2013   CLINICAL DATA:  Anterior cervical disc fusion.  EXAM: DG C-ARM 61-120 MIN; CERVICAL SPINE - 2-3 VIEW  COMPARISON:  MRI scan of April 21, 2013.  FINDINGS: Four intraoperative fluoroscopic images were obtained of the cervical spine. The first image demonstrates surgical probe directed toward the anterior portion of the C6-7 disc space. The second and third images demonstrate surgical needle directed toward the anterior portion of the C7 vertebral body. Fourth image demonstrates surgical probe directed into C6-7 disc space.  IMPRESSION: See above.   Electronically Signed    By: Roque LiasJames  Green M.D.   On: 10/01/2013 08:21    Disposition: 01-Home or Self Care   POD #1 s/p C6-7 ACDF  - resolved R arm pain, minimal neck pain  - Percocet for pain, Valium for muscle spasms, scripts       signed in chart for D/C  -D/C instructions sheet printed and in chart -D/C home today  -F/U in office 2 weeks   Signed: Georga BoraMCKENZIE, Sharod Petsch J 10/07/2013, 10:25 AM

## 2015-08-04 DIAGNOSIS — R202 Paresthesia of skin: Secondary | ICD-10-CM | POA: Diagnosis not present

## 2015-08-04 DIAGNOSIS — I10 Essential (primary) hypertension: Secondary | ICD-10-CM | POA: Diagnosis not present

## 2015-08-04 DIAGNOSIS — E119 Type 2 diabetes mellitus without complications: Secondary | ICD-10-CM | POA: Diagnosis not present

## 2015-08-09 DIAGNOSIS — E7800 Pure hypercholesterolemia, unspecified: Secondary | ICD-10-CM | POA: Diagnosis not present

## 2015-08-09 DIAGNOSIS — E119 Type 2 diabetes mellitus without complications: Secondary | ICD-10-CM | POA: Diagnosis not present

## 2015-08-09 DIAGNOSIS — I10 Essential (primary) hypertension: Secondary | ICD-10-CM | POA: Diagnosis not present

## 2015-08-09 DIAGNOSIS — Z7984 Long term (current) use of oral hypoglycemic drugs: Secondary | ICD-10-CM | POA: Diagnosis not present

## 2015-10-12 DIAGNOSIS — M7752 Other enthesopathy of left foot: Secondary | ICD-10-CM | POA: Diagnosis not present

## 2015-10-12 DIAGNOSIS — M659 Synovitis and tenosynovitis, unspecified: Secondary | ICD-10-CM | POA: Diagnosis not present

## 2015-10-12 DIAGNOSIS — M7670 Peroneal tendinitis, unspecified leg: Secondary | ICD-10-CM | POA: Diagnosis not present

## 2015-10-12 DIAGNOSIS — M7751 Other enthesopathy of right foot: Secondary | ICD-10-CM | POA: Diagnosis not present

## 2015-10-12 DIAGNOSIS — M19071 Primary osteoarthritis, right ankle and foot: Secondary | ICD-10-CM | POA: Diagnosis not present

## 2015-10-12 DIAGNOSIS — M19072 Primary osteoarthritis, left ankle and foot: Secondary | ICD-10-CM | POA: Diagnosis not present

## 2016-04-29 DIAGNOSIS — R51 Headache: Secondary | ICD-10-CM | POA: Diagnosis not present

## 2016-04-29 DIAGNOSIS — I10 Essential (primary) hypertension: Secondary | ICD-10-CM | POA: Diagnosis not present

## 2016-04-29 DIAGNOSIS — E7800 Pure hypercholesterolemia, unspecified: Secondary | ICD-10-CM | POA: Diagnosis not present

## 2016-04-29 DIAGNOSIS — E119 Type 2 diabetes mellitus without complications: Secondary | ICD-10-CM | POA: Diagnosis not present

## 2016-04-29 DIAGNOSIS — Z23 Encounter for immunization: Secondary | ICD-10-CM | POA: Diagnosis not present

## 2016-04-30 DIAGNOSIS — Z1211 Encounter for screening for malignant neoplasm of colon: Secondary | ICD-10-CM | POA: Diagnosis not present

## 2016-06-12 DIAGNOSIS — E119 Type 2 diabetes mellitus without complications: Secondary | ICD-10-CM | POA: Diagnosis not present

## 2016-06-12 DIAGNOSIS — I10 Essential (primary) hypertension: Secondary | ICD-10-CM | POA: Diagnosis not present

## 2016-06-19 DIAGNOSIS — R200 Anesthesia of skin: Secondary | ICD-10-CM | POA: Diagnosis not present

## 2016-06-19 DIAGNOSIS — R42 Dizziness and giddiness: Secondary | ICD-10-CM | POA: Diagnosis not present

## 2016-06-24 ENCOUNTER — Other Ambulatory Visit: Payer: Self-pay | Admitting: Family Medicine

## 2016-06-24 DIAGNOSIS — R200 Anesthesia of skin: Secondary | ICD-10-CM

## 2016-08-25 ENCOUNTER — Emergency Department (HOSPITAL_COMMUNITY)
Admission: EM | Admit: 2016-08-25 | Discharge: 2016-08-25 | Disposition: A | Discharge status: Home / Self Care | Payer: No Typology Code available for payment source | Attending: Emergency Medicine | Admitting: Emergency Medicine

## 2016-08-25 ENCOUNTER — Encounter (HOSPITAL_COMMUNITY): Payer: Self-pay | Admitting: Emergency Medicine

## 2016-08-25 ENCOUNTER — Emergency Department (HOSPITAL_COMMUNITY): Payer: No Typology Code available for payment source

## 2016-08-25 DIAGNOSIS — Y939 Activity, unspecified: Secondary | ICD-10-CM | POA: Insufficient documentation

## 2016-08-25 DIAGNOSIS — E119 Type 2 diabetes mellitus without complications: Secondary | ICD-10-CM | POA: Diagnosis not present

## 2016-08-25 DIAGNOSIS — M549 Dorsalgia, unspecified: Secondary | ICD-10-CM

## 2016-08-25 DIAGNOSIS — S0990XA Unspecified injury of head, initial encounter: Secondary | ICD-10-CM | POA: Diagnosis not present

## 2016-08-25 DIAGNOSIS — W0110XA Fall on same level from slipping, tripping and stumbling with subsequent striking against unspecified object, initial encounter: Secondary | ICD-10-CM | POA: Diagnosis not present

## 2016-08-25 DIAGNOSIS — Y999 Unspecified external cause status: Secondary | ICD-10-CM | POA: Insufficient documentation

## 2016-08-25 DIAGNOSIS — I10 Essential (primary) hypertension: Secondary | ICD-10-CM | POA: Insufficient documentation

## 2016-08-25 DIAGNOSIS — Y929 Unspecified place or not applicable: Secondary | ICD-10-CM | POA: Insufficient documentation

## 2016-08-25 DIAGNOSIS — S93401A Sprain of unspecified ligament of right ankle, initial encounter: Secondary | ICD-10-CM | POA: Insufficient documentation

## 2016-08-25 DIAGNOSIS — S199XXA Unspecified injury of neck, initial encounter: Secondary | ICD-10-CM | POA: Diagnosis present

## 2016-08-25 DIAGNOSIS — Z79899 Other long term (current) drug therapy: Secondary | ICD-10-CM | POA: Diagnosis not present

## 2016-08-25 DIAGNOSIS — S161XXA Strain of muscle, fascia and tendon at neck level, initial encounter: Secondary | ICD-10-CM

## 2016-08-25 DIAGNOSIS — W19XXXA Unspecified fall, initial encounter: Secondary | ICD-10-CM

## 2016-08-25 MED ORDER — NAPROXEN 375 MG PO TABS: 375.0000 mg | ORAL_TABLET | Freq: Two times a day (BID) | ORAL | 0 refills | Status: DC

## 2016-08-25 MED ORDER — IBUPROFEN 200 MG PO TABS
600.0000 mg | ORAL_TABLET | Freq: Once | ORAL | Status: AC
  Administered 2016-08-25: 600 mg via ORAL
  Filled 2016-08-25: qty 3

## 2016-08-25 MED ORDER — CYCLOBENZAPRINE HCL 10 MG PO TABS: 10.0000 mg | ORAL_TABLET | Freq: Two times a day (BID) | ORAL | 0 refills | Status: DC | PRN

## 2016-08-25 NOTE — ED Provider Notes (Signed)
[facility]-EMERGENCY DEPT Provider Note   CSN: [id number] Arrival date & time: 08/25/16  1228     History   Chief Complaint Chief Complaint  Patient presents with  . Fall  . Neck Pain  . Hip Pain  . Ankle Pain    HPI Erica Turner is a 56 y.o. female.  HPI 56 year old Caucasian female past medical history significant for hypertension, diabetes, anterior cervical fusion presents to the emergency department today with multiple complaints following a mechanical fall 2 days ago. Patient states that she slipped and fell at work on a wet floor striking her head on the concrete. Denies any LOC. Reports she felt "dazed" after striking her head on the floor which self resolved. He was able to get herself up. Has been able to work since the event. He was at work today and had continued pain decided come to the emergency department per her daughter's request for evaluation. Patient denies any nausea, vomiting, dizziness, lightheadedness today. Denies any syncopal-like activity. Patient complains of right ankle pain, right hip pain, low back pain, neck pain, headache. Patient denies any vision changes, bruising, paresthesias, saddle paresthesias, loss of bowel or bladder, urinary retention, hematuria. Has been taking Tylenol for her symptoms with little relief  Past Medical History:  Diagnosis Date  . Arthritis    neck and hip  . Diabetes mellitus without complication (HCC)    takes  cinnamion and eat lots of proteins   . Headache(784.0)    migraines  . Hypertension    on meds  . Sinus drainage     Patient Active Problem List   Diagnosis Date Noted  . Radiculopathy 09/30/2013    Past Surgical History:  Procedure Laterality Date  . ANTERIOR CERVICAL DECOMP/DISCECTOMY FUSION N/A 09/30/2013   Procedure: ANTERIOR CERVICAL DECOMPRESSION/DISCECTOMY FUSION 1 LEVEL;  Surgeon: Emilee HeroMark Leonard Dumonski, MD;  Location: [facility] OR;  Service: Orthopedics;  Laterality: N/A;  Anterior cervical decompression  fusion, cervical 6-7 with instrumentation and allograft  . BREAST SURGERY Right    lumpectomy  . CARPAL TUNNEL RELEASE Bilateral   . TONSILLECTOMY    . TUBAL LIGATION    . WISDOM TOOTH EXTRACTION      OB History    No data available       Home Medications    Prior to Admission medications   Medication Sig Start Date End Date Taking? Authorizing Provider  acetaminophen (TYLENOL) 500 MG tablet Take 1,000 mg by mouth every 8 (eight) hours as needed (pain).    [provider]  CINNAMON PO Take 1 tablet by mouth 2 (two) times daily.    [provider]  lisinopril (PRINIVIL,ZESTRIL) 10 MG tablet Take 10 mg by mouth daily.    [provider]  rosuvastatin (CRESTOR) 20 MG tablet Take 20 mg by mouth daily.    [provider]    Family History Family History  Problem Relation Age of Onset  . Hypertension Mother   . Diabetes Mother   . Asthma Mother   . Heart failure Father     Social History Social History  Substance Use Topics  . Smoking status: Never Smoker  . Smokeless tobacco: Never Used  . Alcohol use Yes     Comment: wine rarely     Allergies   Lisinopril and Darvon [propoxyphene]   Review of Systems Review of Systems  Constitutional: Negative for chills and fever.  HENT: Negative for congestion.   Eyes: Negative for photophobia and visual disturbance.  Respiratory:  Negative for cough and shortness of breath.   Cardiovascular: Negative for chest pain and palpitations.  Gastrointestinal: Negative for abdominal pain, diarrhea, nausea and vomiting.  Genitourinary: Negative for hematuria.  Musculoskeletal: Positive for arthralgias, back pain, joint swelling, myalgias, neck pain and neck stiffness. Negative for gait problem.  Skin: Negative for color change and wound.  Neurological: Positive for headaches. Negative for dizziness, syncope, weakness and light-headedness.     Physical Exam Updated Vital Signs BP 123/73 (BP  Location: Right Arm)   Pulse 62   Temp 98.1 F (36.7 C) (Oral)   Resp 18   Wt 71.7 kg   SpO2 97%   BMI 27.55 kg/m   Physical Exam Physical Exam  Constitutional: Pt is oriented to person, place, and time. Appears well-developed and well-nourished. No distress.  HENT:  Head: Normocephalic. Small hematoma noted to the occiput without any laceration no bleeding at this time. Nose: Nose normal. No septal hematoma. Ears: No bilateral hemotympanum Mouth/Throat: Uvula is midline, oropharynx is clear and moist and mucous membranes are normal.  Eyes: Conjunctivae and EOM are normal. Pupils are equal, round, and reactive to light.  Neck: No spinous process tenderness and no muscular tenderness present. No rigidity. Normal range of motion present.  Full ROM without pain midline cervical tenderness No crepitus, deformity or step-offs bilteral paraspinal tenderness that radiates to upper trapezius Cardiovascular: Normal rate, regular rhythm and intact distal pulses.   Pulses:      Radial pulses are 2+ on the right side, and 2+ on the left side.       Dorsalis pedis pulses are 2+ on the right side, and 2+ on the left side.       Posterior tibial pulses are 2+ on the right side, and 2+ on the left side.  Pulmonary/Chest: Effort normal and breath sounds normal. No accessory muscle usage. No respiratory distress. No decreased breath sounds. No wheezes. No rhonchi. No rales. Exhibits no tenderness and no bony tenderness.   no ecchymosis  No flail segment, crepitus or deformity Equal chest expansion  Abdominal: Soft. Normal appearance and bowel sounds are normal. There is no tenderness. There is no rigidity, no guarding and no CVA tenderness.   no ecchymosis  Abd soft and nontender  Musculoskeletal: Normal range of motion.       Thoracic back: Exhibits normal range of motion.       Lumbar back: Exhibits normal range of motion.  Full range of motion of the T-spine and L-spine No tenderness to  palpation of the spinous processes of the T-spine or L-spine No crepitus, deformity or step-offs Mild tenderness to palpation of the right paraspinous muscles of the L-spine Patient was swelling to the right lateral malleolus with mild tenderness to palpation. No ecchymosis noted. Full range of motion. DP pulses are 2+ bilaterally. Sensation intact sharp/dull. Cap refill normal. No midfoot tenderness.  Lymphadenopathy:    Pt has no cervical adenopathy.  Neurological: Pt is alert and oriented to person, place, and time. Normal reflexes. No cranial nerve deficit. GCS eye subscore is 4. GCS verbal subscore is 5. GCS motor subscore is 6.  Reflex Scores:      Bicep reflexes are 2+ on the right side and 2+ on the left side.      Brachioradialis reflexes are 2+ on the right side and 2+ on the left side.      Patellar reflexes are 2+ on the right side and 2+ on the left side.  Achilles reflexes are 2+ on the right side and 2+ on the left side. Speech is clear and goal oriented, follows commands Normal 5/5 strength in upper and lower extremities bilaterally including dorsiflexion and plantar flexion, strong and equal grip strength Sensation normal to light and sharp touch Moves extremities without ataxia, coordination intact Normal gait and balance No Clonus  Skin: Skin is warm and dry. No rash noted. Pt is not diaphoretic. No erythema.  Psychiatric: Normal mood and affect.  Nursing note and vitals reviewed.    ED Treatments / Results  Labs (all labs ordered are listed, but only abnormal results are displayed) Labs Reviewed - No data to display  EKG  EKG Interpretation None       Radiology Dg Ribs Unilateral W/chest Right  Result Date: 08/25/2016 CLINICAL DATA:  Pt c/o right > left body and back pain s/p slip, fall on wet floor at work x 2 days ago. Previous neck surgery. EXAM: RIGHT RIBS AND CHEST - 3+ VIEW COMPARISON:  Chest x-ray dated 09/28/2013. FINDINGS: No fracture or other  bone lesions are seen involving the ribs. There is no evidence of pneumothorax or pleural effusion. Both lungs are clear. Heart size and mediastinal contours are within normal limits. IMPRESSION: Negative. Electronically Signed   By: Bary Richard M.D.   On: 08/25/2016 14:19   Dg Lumbar Spine Complete  Result Date: 08/25/2016 CLINICAL DATA:  Pt c/o right > left body and back pain s/p slip, fall on wet floor at work x 2 days ago. Previous neck surgery. EXAM: LUMBAR SPINE - COMPLETE 4+ VIEW COMPARISON:  None. FINDINGS: Alignment is normal. No fracture line or displaced fracture fragment. No evidence of acute compression fracture. Mild degenerative spurring at multiple levels. No evidence of advanced degenerative change at any level. No evidence of pars interarticularis defect seen. Upper sacrum appears intact and normally aligned. Atherosclerotic changes seen along the walls of the infrarenal abdominal aorta. Visualized paravertebral soft tissues are otherwise unremarkable. IMPRESSION: 1. No acute findings. 2. Aortic atherosclerosis. Electronically Signed   By: Bary Richard M.D.   On: 08/25/2016 14:17   Dg Ankle Complete Right  Result Date: 08/25/2016 CLINICAL DATA:  Pt c/o right > left body and back pain s/p slip, fall on wet floor at work x 2 days ago. Previous neck surgery. EXAM: RIGHT ANKLE - COMPLETE 3+ VIEW COMPARISON:  None. FINDINGS: There is no evidence of fracture, dislocation, or joint effusion. There is no evidence of arthropathy or other focal bone abnormality. Soft tissues are unremarkable. IMPRESSION: Negative. Electronically Signed   By: Bary Richard M.D.   On: 08/25/2016 14:15   Ct Head Wo Contrast  Result Date: 08/25/2016 CLINICAL DATA:  Pt reports pain in neck, back,head, and hip and r/ankle. Pt stated that she slip and fell, striking her head on concrete floor two days ago. Pt stated that she felt dazed when she struck the floor. Denies N/V or dizziness today EXAM: CT HEAD WITHOUT  CONTRAST CT CERVICAL SPINE WITHOUT CONTRAST TECHNIQUE: Multidetector CT imaging of the head and cervical spine was performed following the standard protocol without intravenous contrast. Multiplanar CT image reconstructions of the cervical spine were also generated. COMPARISON:  MRI of the cervical spine dated 04/21/2013. FINDINGS: CT HEAD FINDINGS Brain: Ventricles are normal in size and configuration. There is no mass, hemorrhage, edema or other evidence of acute parenchymal abnormality. No extra-axial hemorrhage. Vascular: No hyperdense vessel or unexpected calcification. Skull: Normal. Negative for fracture or focal lesion. Sinuses/Orbits:  No acute finding. Other: None. CT CERVICAL SPINE FINDINGS Alignment: Mild dextroscoliosis. No evidence of acute vertebral body subluxation. Skull base and vertebrae: No fracture line or displaced fracture fragment identified. Facet joints appear intact and normally aligned throughout. Soft tissues and spinal canal: No prevertebral fluid or swelling. No visible canal hematoma. Disc levels: Anterior cervical fusion at the C6-7 level, with intervening disc spacer. Hardware appears intact and appropriately positioned. Associated mild osseous hypertrophy at the posterior edge of the disc space is resulting in mild-to-moderate central canal stenosis. Remainder of the disc spaces are well maintained. Upper chest: Negative. Other: None. IMPRESSION: 1. No acute intracranial abnormality. No intracranial mass, hemorrhage or edema. No skull fracture. 2. No fracture or acute subluxation within the cervical spine. 3. Anterior cervical fusion hardware at the C6-7 level, with intervening disc spacer/cage. Hardware appears intact and appropriately positioned. Associated mild osseous hypertrophy at the posterior margin of the disc space is causing a mild to moderate central canal stenosis. Electronically Signed   By: Bary Richard M.D.   On: 08/25/2016 14:25   Ct Cervical Spine Wo  Contrast  Result Date: 08/25/2016 CLINICAL DATA:  Pt reports pain in neck, back,head, and hip and r/ankle. Pt stated that she slip and fell, striking her head on concrete floor two days ago. Pt stated that she felt dazed when she struck the floor. Denies N/V or dizziness today EXAM: CT HEAD WITHOUT CONTRAST CT CERVICAL SPINE WITHOUT CONTRAST TECHNIQUE: Multidetector CT imaging of the head and cervical spine was performed following the standard protocol without intravenous contrast. Multiplanar CT image reconstructions of the cervical spine were also generated. COMPARISON:  MRI of the cervical spine dated 04/21/2013. FINDINGS: CT HEAD FINDINGS Brain: Ventricles are normal in size and configuration. There is no mass, hemorrhage, edema or other evidence of acute parenchymal abnormality. No extra-axial hemorrhage. Vascular: No hyperdense vessel or unexpected calcification. Skull: Normal. Negative for fracture or focal lesion. Sinuses/Orbits: No acute finding. Other: None. CT CERVICAL SPINE FINDINGS Alignment: Mild dextroscoliosis. No evidence of acute vertebral body subluxation. Skull base and vertebrae: No fracture line or displaced fracture fragment identified. Facet joints appear intact and normally aligned throughout. Soft tissues and spinal canal: No prevertebral fluid or swelling. No visible canal hematoma. Disc levels: Anterior cervical fusion at the C6-7 level, with intervening disc spacer. Hardware appears intact and appropriately positioned. Associated mild osseous hypertrophy at the posterior edge of the disc space is resulting in mild-to-moderate central canal stenosis. Remainder of the disc spaces are well maintained. Upper chest: Negative. Other: None. IMPRESSION: 1. No acute intracranial abnormality. No intracranial mass, hemorrhage or edema. No skull fracture. 2. No fracture or acute subluxation within the cervical spine. 3. Anterior cervical fusion hardware at the C6-7 level, with intervening disc  spacer/cage. Hardware appears intact and appropriately positioned. Associated mild osseous hypertrophy at the posterior margin of the disc space is causing a mild to moderate central canal stenosis. Electronically Signed   By: Bary Richard M.D.   On: 08/25/2016 14:25   Dg Hip Unilat W Or Wo Pelvis 2-3 Views Right  Result Date: 08/25/2016 CLINICAL DATA:  Pt c/o right > left body and back pain s/p slip, fall on wet floor at work x 2 days ago. Previous neck surgery. EXAM: DG HIP (WITH OR WITHOUT PELVIS) 2-3V RIGHT COMPARISON:  None. FINDINGS: Single view of the pelvis and two views of the right hip are provided. Osseous alignment is normal. No fracture line or displaced fracture fragment seen. No  significant degenerative change. Soft tissues about the pelvis and right hip are unremarkable. IMPRESSION: Negative. Electronically Signed   By: Bary Richard M.D.   On: 08/25/2016 14:17    Procedures Procedures (including critical care time)  Medications Ordered in ED Medications  ibuprofen (ADVIL,MOTRIN) tablet 600 mg (not administered)     Initial Impression / Assessment and Plan / ED Course  I have reviewed the triage vital signs and the nursing notes.  Pertinent labs & imaging results that were available during my care of the patient were reviewed by me and considered in my medical decision making (see chart for details).     Patient presents to the emergency Department today with complaints of mechanical fall 2 days ago and pain in her head, neck, right ankle, right hip, low back. Patient is a focal neuro deficits. She is neurovascularly intact in all extremities. We'll obtain imaging.  Imaging return that showed no acute findings. Chronic findings were noted and discussed with patient. We'll treat with anti-inflammatories and muscle relaxers. Pt is hemodynamically stable, in NAD, & able to ambulate in the ED. Pain has been managed & has no complaints prior to dc. Pt is comfortable with above  plan and is stable for discharge at this time. All questions were answered prior to disposition. Strict return precautions for f/u to the ED were discussed.   Final Clinical Impressions(s) / ED Diagnoses   Final diagnoses:  Fall  Sprain of right ankle, unspecified ligament, initial encounter  Injury of head, initial encounter  Strain of neck muscle, initial encounter  Acute back pain, unspecified back location, unspecified back pain laterality    New Prescriptions Discharge Medication List as of 08/25/2016  3:12 PM    START taking these medications   Details  cyclobenzaprine (FLEXERIL) 10 MG tablet Take 1 tablet (10 mg total) by mouth 2 (two) times daily as needed for muscle spasms., Starting Sun 08/25/2016, Print    naproxen (NAPROSYN) 375 MG tablet Take 1 tablet (375 mg total) by mouth 2 (two) times daily., Starting Sun 08/25/2016, Print         Rise Mu, PA-C 08/25/16 2303    Charlynne Pander, MD 08/27/16 [phone]

## 2016-08-25 NOTE — ED Triage Notes (Signed)
Pt reports pain in neck, back,head, and hip and r/ankle. Pt stated that she slip and fell, striking her head on concrete floor two days ago.. Pt stated that she felt dazed when she struck the floor. Denies N/V or dizziness today.Pt is alert, oriented and ambulatory.

## 2016-08-25 NOTE — Discharge Instructions (Signed)
All of your imaging has been reassuring. This is likely musculoskeletal sprains. Naproxen as needed for pain. Use Flexeril for muscle relaxation (medication makes her drowsy. She'll follow-up with her primary care doctor or return to ED if your symptoms worsen.

## 2016-08-25 NOTE — ED Notes (Signed)
PT DISCHARGED. INSTRUCTIONS AND PRESCRIPTIONS GIVEN. AAOX4. PT IN NO APPARENT DISTRESS WITH MILD PAIN. THE OPPORTUNITY TO ASK QUESTIONS WAS PROVIDED. 

## 2016-09-12 ENCOUNTER — Emergency Department (HOSPITAL_COMMUNITY)
Admission: EM | Admit: 2016-09-12 | Discharge: 2016-09-12 | Disposition: A | Discharge status: Home / Self Care | Payer: Worker's Compensation | Attending: Emergency Medicine | Admitting: Emergency Medicine

## 2016-09-12 ENCOUNTER — Emergency Department (HOSPITAL_COMMUNITY): Payer: Worker's Compensation

## 2016-09-12 ENCOUNTER — Encounter (HOSPITAL_COMMUNITY): Payer: Self-pay

## 2016-09-12 DIAGNOSIS — Z7982 Long term (current) use of aspirin: Secondary | ICD-10-CM | POA: Insufficient documentation

## 2016-09-12 DIAGNOSIS — Y929 Unspecified place or not applicable: Secondary | ICD-10-CM | POA: Diagnosis not present

## 2016-09-12 DIAGNOSIS — M25511 Pain in right shoulder: Secondary | ICD-10-CM

## 2016-09-12 DIAGNOSIS — S199XXA Unspecified injury of neck, initial encounter: Secondary | ICD-10-CM | POA: Diagnosis not present

## 2016-09-12 DIAGNOSIS — M791 Myalgia: Secondary | ICD-10-CM | POA: Diagnosis not present

## 2016-09-12 DIAGNOSIS — Y990 Civilian activity done for income or pay: Secondary | ICD-10-CM | POA: Insufficient documentation

## 2016-09-12 DIAGNOSIS — I10 Essential (primary) hypertension: Secondary | ICD-10-CM | POA: Insufficient documentation

## 2016-09-12 DIAGNOSIS — Z7984 Long term (current) use of oral hypoglycemic drugs: Secondary | ICD-10-CM | POA: Insufficient documentation

## 2016-09-12 DIAGNOSIS — Z79899 Other long term (current) drug therapy: Secondary | ICD-10-CM | POA: Diagnosis not present

## 2016-09-12 DIAGNOSIS — M7918 Myalgia, other site: Secondary | ICD-10-CM

## 2016-09-12 DIAGNOSIS — W1839XA Other fall on same level, initial encounter: Secondary | ICD-10-CM | POA: Insufficient documentation

## 2016-09-12 DIAGNOSIS — E119 Type 2 diabetes mellitus without complications: Secondary | ICD-10-CM | POA: Insufficient documentation

## 2016-09-12 DIAGNOSIS — W19XXXA Unspecified fall, initial encounter: Secondary | ICD-10-CM

## 2016-09-12 DIAGNOSIS — Y939 Activity, unspecified: Secondary | ICD-10-CM | POA: Insufficient documentation

## 2016-09-12 DIAGNOSIS — M25521 Pain in right elbow: Secondary | ICD-10-CM | POA: Diagnosis not present

## 2016-09-12 DIAGNOSIS — M542 Cervicalgia: Secondary | ICD-10-CM

## 2016-09-12 NOTE — ED Provider Notes (Signed)
[facility]-EMERGENCY DEPT Provider Note   CSN: [id number] Arrival date & time: 09/12/16  0901     History   Chief Complaint No chief complaint on file.   HPI Erica Turner is a 56 y.o. female who presents today two days after a repeat mechanical fall at work.  She reports that she fell and landed on her right elbow, right hip.  Denies striking her head, no LOC.  She was able to get up after and walk around.  She reports that her elbow is bothering her the most and the "middle of my upper arm".  She additionally reports pain in her right buttock and on her right "lower back ribs."   She reports that immediately after the fall that her elbow was bothering her the most and that over the past day the remainder of her pains have appeared and worsened.  She denies change in sensation, changes in bowel or bladder function.  She does not have a headache, no N/V/D.  No CP or SOB.    Of note patient fell at work on approximately 08/23/16, patient reports this is the "exact same spot" and that she thinks she may have exacerbated her symptoms from that fall with her fall two days ago.    HPI  Past Medical History:  Diagnosis Date  . Arthritis    neck and hip  . Diabetes mellitus without complication (HCC)    takes  cinnamion and eat lots of proteins   . Headache(784.0)    migraines  . Hypertension    on meds  . Sinus drainage     Patient Active Problem List   Diagnosis Date Noted  . Radiculopathy 09/30/2013    Past Surgical History:  Procedure Laterality Date  . ANTERIOR CERVICAL DECOMP/DISCECTOMY FUSION N/A 09/30/2013   Procedure: ANTERIOR CERVICAL DECOMPRESSION/DISCECTOMY FUSION 1 LEVEL;  Surgeon: Emilee Hero, MD;  Location: [facility] OR;  Service: Orthopedics;  Laterality: N/A;  Anterior cervical decompression fusion, cervical 6-7 with instrumentation and allograft  . BREAST SURGERY Right    lumpectomy  . CARPAL TUNNEL RELEASE Bilateral   . TONSILLECTOMY    . TUBAL LIGATION    .  WISDOM TOOTH EXTRACTION      OB History    No data available       Home Medications    Prior to Admission medications   Medication Sig Start Date End Date Taking? Authorizing Provider  acetaminophen (TYLENOL) 500 MG tablet Take 1,000 mg by mouth every 8 (eight) hours as needed (for pain).    Yes [provider]  ALPRAZolam (XANAX) 0.5 MG tablet Take 0.25-0.5 mg by mouth 3 (three) times daily as needed for anxiety.   Yes [provider]  amLODipine (NORVASC) 10 MG tablet Take 10 mg by mouth daily.   Yes [provider]  aspirin EC 325 MG tablet Take 325 mg by mouth daily.   Yes [provider]  atorvastatin (LIPITOR) 40 MG tablet Take 40 mg by mouth daily.   Yes [provider]  cyclobenzaprine (FLEXERIL) 10 MG tablet Take 1 tablet (10 mg total) by mouth 2 (two) times daily as needed for muscle spasms. 08/25/16  Yes Leaphart, Lynann Beaver, PA-C  fluticasone (FLONASE) 50 MCG/ACT nasal spray Place 1 spray into both nostrils daily as needed for allergies or rhinitis.   Yes [provider]  glipiZIDE (GLUCOTROL XL) 5 MG 24 hr tablet Take 5 mg by mouth daily with breakfast.   Yes [provider]  ibuprofen (ADVIL,MOTRIN) 200 MG tablet Take 400 mg by mouth every 6 (six) hours as needed for fever, headache, mild pain, moderate pain or cramping.   Yes [provider]  metFORMIN (GLUCOPHAGE-XR) 500 MG 24 hr tablet Take 1,000 mg by mouth 2 (two) times daily.    Yes [provider]  naproxen (NAPROSYN) 375 MG tablet Take 1 tablet (375 mg total) by mouth 2 (two) times daily. 08/25/16  Yes Leaphart, Lynann Beaver, PA-C  naproxen sodium (ANAPROX) 220 MG tablet Take 660 mg by mouth every 12 (twelve) hours as needed (for pain).   Yes [provider]  valsartan (DIOVAN) 80 MG tablet Take 80 mg by mouth daily.   Yes [provider]    Family History Family History  Problem Relation Age of Onset  . Hypertension  Mother   . Diabetes Mother   . Asthma Mother   . Heart failure Father     Social History Social History  Substance Use Topics  . Smoking status: Never Smoker  . Smokeless tobacco: Never Used  . Alcohol use Yes     Comment: wine rarely     Allergies   Lisinopril; Pneumococcal vaccines; and Darvon [propoxyphene]   Review of Systems Review of Systems  Constitutional: Negative for fatigue and fever.  HENT: Negative for facial swelling and sore throat.   Eyes: Negative for pain and visual disturbance.  Respiratory: Negative for cough, chest tightness and shortness of breath.   Cardiovascular: Negative for chest pain and palpitations.  Gastrointestinal: Negative for abdominal pain, diarrhea, nausea and vomiting.  Genitourinary: Negative for difficulty urinating, frequency, hematuria and urgency.  Musculoskeletal: Positive for arthralgias, myalgias and neck pain. Negative for gait problem, joint swelling and neck stiffness.  Skin: Positive for wound.  Neurological: Negative for dizziness, speech difficulty, weakness, light-headedness and headaches.     Physical Exam Updated Vital Signs BP (!) 142/76 (BP Location: Left Arm)   Pulse 66   Temp 97.7 F (36.5 C) (Oral)   Resp 16   Ht 5\' 3"  (1.6 m)   Wt 67.1 kg (148 lb)   SpO2 100%   BMI 26.22 kg/m   Physical Exam  Constitutional: She appears well-developed and well-nourished. No distress.  HENT:  Head: Normocephalic and atraumatic.  Eyes: Conjunctivae and EOM are normal. Pupils are equal, round, and reactive to light. No scleral icterus.  Neck: Normal range of motion. Neck supple. No spinous process tenderness present. No neck rigidity. Normal range of motion present.  Cardiovascular: Normal rate, regular rhythm, normal heart sounds and intact distal pulses.   No murmur heard. Pulmonary/Chest: Effort normal and breath sounds normal. No stridor. No respiratory distress. She has no wheezes. She exhibits tenderness (Posterior  right lower ribs).  Abdominal: Soft. Normal appearance and bowel sounds are normal. She exhibits no distension. There is no tenderness. There is no guarding.  Musculoskeletal: Normal range of motion. She exhibits tenderness. She exhibits no edema or deformity.       Right shoulder: She exhibits pain. She exhibits no tenderness, no bony tenderness, no deformity, no laceration and no spasm.       Right elbow: She exhibits no swelling, no deformity and no laceration (Small abrasion over olecranon process). Tenderness found. Medial epicondyle tenderness noted. No radial head, no lateral epicondyle and no olecranon process tenderness noted.       Right hip: She exhibits normal range of motion, normal strength, no bony tenderness and no deformity.  Left hip: Normal.       Cervical back: She exhibits tenderness, bony tenderness and pain. She exhibits no swelling, no edema, no deformity and no spasm.       Thoracic back: She exhibits no tenderness, no bony tenderness and no deformity.       Lumbar back: She exhibits no tenderness, no bony tenderness and no swelling.       Right upper arm: She exhibits tenderness and bony tenderness. She exhibits no swelling and no edema.       Right forearm: She exhibits no tenderness and no bony tenderness.  Right buttock is tender to palpation over piriformis muscle.  R hip has full painless ROM (including internal and external rotation) and good strength.  Bilateral lower extremities have good strength, sensation and are warm and well perfused with painless ROM.      Lymphadenopathy:    She has no cervical adenopathy.  Neurological: She is alert. She has normal strength. She displays atrophy. She displays no tremor. No cranial nerve deficit or sensory deficit. She exhibits normal muscle tone. Coordination and gait normal.  Skin: Skin is warm and dry. She is not diaphoretic.  Psychiatric: She has a normal mood and affect. Her behavior is normal.  Nursing note and  vitals reviewed.    ED Treatments / Results  Labs (all labs ordered are listed, but only abnormal results are displayed) Labs Reviewed - No data to display  EKG  EKG Interpretation None       Radiology Dg Clavicle Right  Result Date: 09/12/2016 CLINICAL DATA:  Pain following fall EXAM: RIGHT CLAVICLE - 2+ VIEWS COMPARISON:  None. FINDINGS: Frontal and tilt frontal images obtained. No fracture or dislocation. There is osteoarthritic change in the acromioclavicular joint. There is an apparent bone island in proximal right humeral metaphysis. Visualized right lung clear. IMPRESSION: Osteoarthritic change in the acromioclavicular joint. No fracture or dislocation. Electronically Signed   By: Bretta BangWilliam  Woodruff III M.D.   On: 09/12/2016 10:50   Dg Elbow Complete Right  Result Date: 09/12/2016 CLINICAL DATA:  Pain following fall EXAM: RIGHT ELBOW - COMPLETE 3+ VIEW COMPARISON:  None. FINDINGS: Frontal, lateral, and bilateral oblique views were obtained. There is no fracture or dislocation. No joint effusion. The joint spaces appear unremarkable. No erosive change. IMPRESSION: No demonstrable fracture or dislocation. No appreciable arthropathy. Electronically Signed   By: Bretta BangWilliam  Woodruff III M.D.   On: 09/12/2016 10:51   Ct Cervical Spine Wo Contrast  Result Date: 09/12/2016 CLINICAL DATA:  Stepped into a dip in the floor at work, fell landing on her RIGHT hip, RIGHT ribs and RIGHT elbow, neck pain, history of prior cervical spine fusion EXAM: CT CERVICAL SPINE WITHOUT CONTRAST TECHNIQUE: Multidetector CT imaging of the cervical spine was performed without intravenous contrast. Multiplanar CT image reconstructions were also generated. COMPARISON:  None. FINDINGS: Alignment: Normal Skull base and vertebrae: Skullbase intact. Osseous mineralization normal. Anterior plate and screws at C6-C7 post anterior fusion with intervening disc prosthesis unchanged. Vertebral body and remaining disc space  heights maintained. No acute fracture, subluxation, or bone destruction. Soft tissues and spinal canal: Prevertebral soft tissues normal thickness. Disc levels: Disc prosthesis C6-C7. Remaining disc space heights maintained and unremarkable Upper chest: Lung apices clear Other: N/A IMPRESSION: Prior anterior fusion C6-C7, stable. No acute cervical spine abnormalities. Electronically Signed   By: Ulyses SouthwardMark  Boles M.D.   On: 09/12/2016 10:57   Dg Humerus Right  Result Date: 09/12/2016 CLINICAL DATA:  Pain  following fall EXAM: RIGHT HUMERUS - 2+ VIEW COMPARISON:  None. FINDINGS: Frontal and lateral views were obtained. No fracture or dislocation. There is a probable bone island proximal humeral metaphysis. There is osteoarthritic change in acromioclavicular joint. IMPRESSION: No fracture or dislocation. Osteoarthritic change in acromioclavicular joint. Probable small bone island in the proximal humeral metaphysis. Electronically Signed   By: Bretta Bang III M.D.   On: 09/12/2016 10:52    Procedures Procedures (including critical care time)  Medications Ordered in ED Medications - No data to display   Initial Impression / Assessment and Plan / ED Course  I have reviewed the triage vital signs and the nursing notes.  Pertinent labs & imaging results that were available during my care of the patient were reviewed by me and considered in my medical decision making (see chart for details).    Takyia KRYSTIANA FORNES presents two days after a fall at work and symptoms consistent with musculoskeletal pain.  Joint decision making was performed and x-rays were performed of RUE, and CT of C-spine.  Patient chose not to pursue imaging of ribs and right hip/buttock. Of imaging performed there were no acute abnormalities.  Results were discussed with patient and all her questions were answered to the best of my abilities.     At this time there does not appear to be any evidence of an acute emergency medical  condition and the patient appears stable for discharge with appropriate outpatient follow up.Diagnosis was discussed with patient who verbalizes understanding and is agreeable to discharge. Pt case discussed with Dr. Denton Lank who agrees with my plan and saw the patient.    Final Clinical Impressions(s) / ED Diagnoses   Final diagnoses:  Fall, initial encounter  Right elbow pain  Acute pain of right shoulder  Neck pain  Right buttock pain    New Prescriptions New Prescriptions   No medications on file     Norman Clay 09/12/16 1156    Cathren Laine, MD 09/12/16 1505

## 2016-09-12 NOTE — ED Notes (Signed)
PT DISCHARGED. INSTRUCTIONS GIVEN. AAOX4. PT IN NO APPARENT DISTRESS WITH MILD PAIN. THE OPPORTUNITY TO ASK QUESTIONS WAS PROVIDED. 

## 2016-09-12 NOTE — Discharge Instructions (Signed)
Please take Ibuprofen (Advil, motrin) and Tylenol (acetaminophen) to relieve your pain.  You may take up to 800 MG (4 pills) of normal strength ibuprofen every 8 hours as needed.  In between doses of ibuprofen you make take tylenol, up to 1,000 mg (two extra strength pills).  Do not take more than 3,000 mg tylenol in a 24 hour period.  Please check all medication labels as many medications such as pain and cold medications may contain tylenol.  Do not drink while taking these medications.  Do not take other NSAID'S while taking ibuprofen (such as aleve or naproxen).  Please follow up with your PCP if your symptoms persist.

## 2016-09-12 NOTE — ED Triage Notes (Signed)
Patient reports that she stepped into a dip in the floor at work, fell landed on her right hip, right rib and right elbow. Patient c/o right rib pain, entire right arm, right shoulder, and right hip.

## 2016-11-28 ENCOUNTER — Other Ambulatory Visit: Payer: Self-pay | Admitting: Family Medicine

## 2016-11-28 ENCOUNTER — Other Ambulatory Visit (HOSPITAL_COMMUNITY)
Admission: RE | Admit: 2016-11-28 | Discharge: 2016-11-28 | Disposition: A | Discharge status: Home / Self Care | Payer: BLUE CROSS/BLUE SHIELD | Source: Ambulatory Visit | Attending: Family Medicine | Admitting: Family Medicine

## 2016-11-28 DIAGNOSIS — Z01411 Encounter for gynecological examination (general) (routine) with abnormal findings: Secondary | ICD-10-CM | POA: Diagnosis not present

## 2016-11-28 DIAGNOSIS — E1165 Type 2 diabetes mellitus with hyperglycemia: Secondary | ICD-10-CM | POA: Diagnosis not present

## 2016-11-28 DIAGNOSIS — Z0000 Encounter for general adult medical examination without abnormal findings: Secondary | ICD-10-CM | POA: Diagnosis not present

## 2016-12-02 LAB — CYTOLOGY - PAP: DIAGNOSIS: NEGATIVE

## 2017-01-09 DIAGNOSIS — E119 Type 2 diabetes mellitus without complications: Secondary | ICD-10-CM | POA: Diagnosis not present

## 2017-06-04 DIAGNOSIS — E119 Type 2 diabetes mellitus without complications: Secondary | ICD-10-CM | POA: Diagnosis not present

## 2017-06-04 DIAGNOSIS — I10 Essential (primary) hypertension: Secondary | ICD-10-CM | POA: Diagnosis not present

## 2017-06-04 DIAGNOSIS — Z1211 Encounter for screening for malignant neoplasm of colon: Secondary | ICD-10-CM | POA: Diagnosis not present

## 2017-06-04 DIAGNOSIS — E7800 Pure hypercholesterolemia, unspecified: Secondary | ICD-10-CM | POA: Diagnosis not present

## 2017-06-06 DIAGNOSIS — Z1211 Encounter for screening for malignant neoplasm of colon: Secondary | ICD-10-CM | POA: Diagnosis not present

## 2017-06-06 DIAGNOSIS — E7800 Pure hypercholesterolemia, unspecified: Secondary | ICD-10-CM | POA: Diagnosis not present

## 2017-06-06 DIAGNOSIS — E119 Type 2 diabetes mellitus without complications: Secondary | ICD-10-CM | POA: Diagnosis not present

## 2018-01-28 DIAGNOSIS — Z23 Encounter for immunization: Secondary | ICD-10-CM | POA: Diagnosis not present

## 2018-01-28 DIAGNOSIS — E1165 Type 2 diabetes mellitus with hyperglycemia: Secondary | ICD-10-CM | POA: Diagnosis not present

## 2018-01-28 DIAGNOSIS — Z0000 Encounter for general adult medical examination without abnormal findings: Secondary | ICD-10-CM | POA: Diagnosis not present

## 2018-01-28 DIAGNOSIS — E7800 Pure hypercholesterolemia, unspecified: Secondary | ICD-10-CM | POA: Diagnosis not present

## 2018-01-28 DIAGNOSIS — Z1211 Encounter for screening for malignant neoplasm of colon: Secondary | ICD-10-CM | POA: Diagnosis not present

## 2018-02-03 DIAGNOSIS — Z1211 Encounter for screening for malignant neoplasm of colon: Secondary | ICD-10-CM | POA: Diagnosis not present

## 2018-03-16 ENCOUNTER — Other Ambulatory Visit: Payer: Self-pay | Admitting: Family Medicine

## 2018-03-16 DIAGNOSIS — R202 Paresthesia of skin: Secondary | ICD-10-CM

## 2018-03-16 DIAGNOSIS — M7989 Other specified soft tissue disorders: Secondary | ICD-10-CM | POA: Diagnosis not present

## 2018-03-18 ENCOUNTER — Ambulatory Visit
Admission: RE | Admit: 2018-03-18 | Discharge: 2018-03-18 | Disposition: A | Discharge status: Home / Self Care | Payer: BLUE CROSS/BLUE SHIELD | Source: Ambulatory Visit | Attending: Family Medicine | Admitting: Family Medicine

## 2018-03-18 DIAGNOSIS — R202 Paresthesia of skin: Secondary | ICD-10-CM

## 2018-04-17 DIAGNOSIS — J029 Acute pharyngitis, unspecified: Secondary | ICD-10-CM | POA: Diagnosis not present

## 2018-04-17 DIAGNOSIS — R509 Fever, unspecified: Secondary | ICD-10-CM | POA: Diagnosis not present

## 2018-04-17 DIAGNOSIS — R05 Cough: Secondary | ICD-10-CM | POA: Diagnosis not present

## 2018-08-11 DIAGNOSIS — J309 Allergic rhinitis, unspecified: Secondary | ICD-10-CM | POA: Diagnosis not present

## 2018-08-11 DIAGNOSIS — E119 Type 2 diabetes mellitus without complications: Secondary | ICD-10-CM | POA: Diagnosis not present

## 2018-08-11 DIAGNOSIS — L270 Generalized skin eruption due to drugs and medicaments taken internally: Secondary | ICD-10-CM | POA: Diagnosis not present

## 2018-08-11 DIAGNOSIS — I10 Essential (primary) hypertension: Secondary | ICD-10-CM | POA: Diagnosis not present

## 2018-08-21 IMAGING — CR DG HIP (WITH OR WITHOUT PELVIS) 2-3V*R*
3 series · 3 of 3 positions shown · non-contrast
Comparison: None.

CLINICAL DATA: Pt c/o right > left body and back pain s/p slip,
fall on wet floor at work x 2 days ago. Previous neck surgery.

EXAM:
DG HIP (WITH OR WITHOUT PELVIS) 2-3V RIGHT

[t pelvis ap]
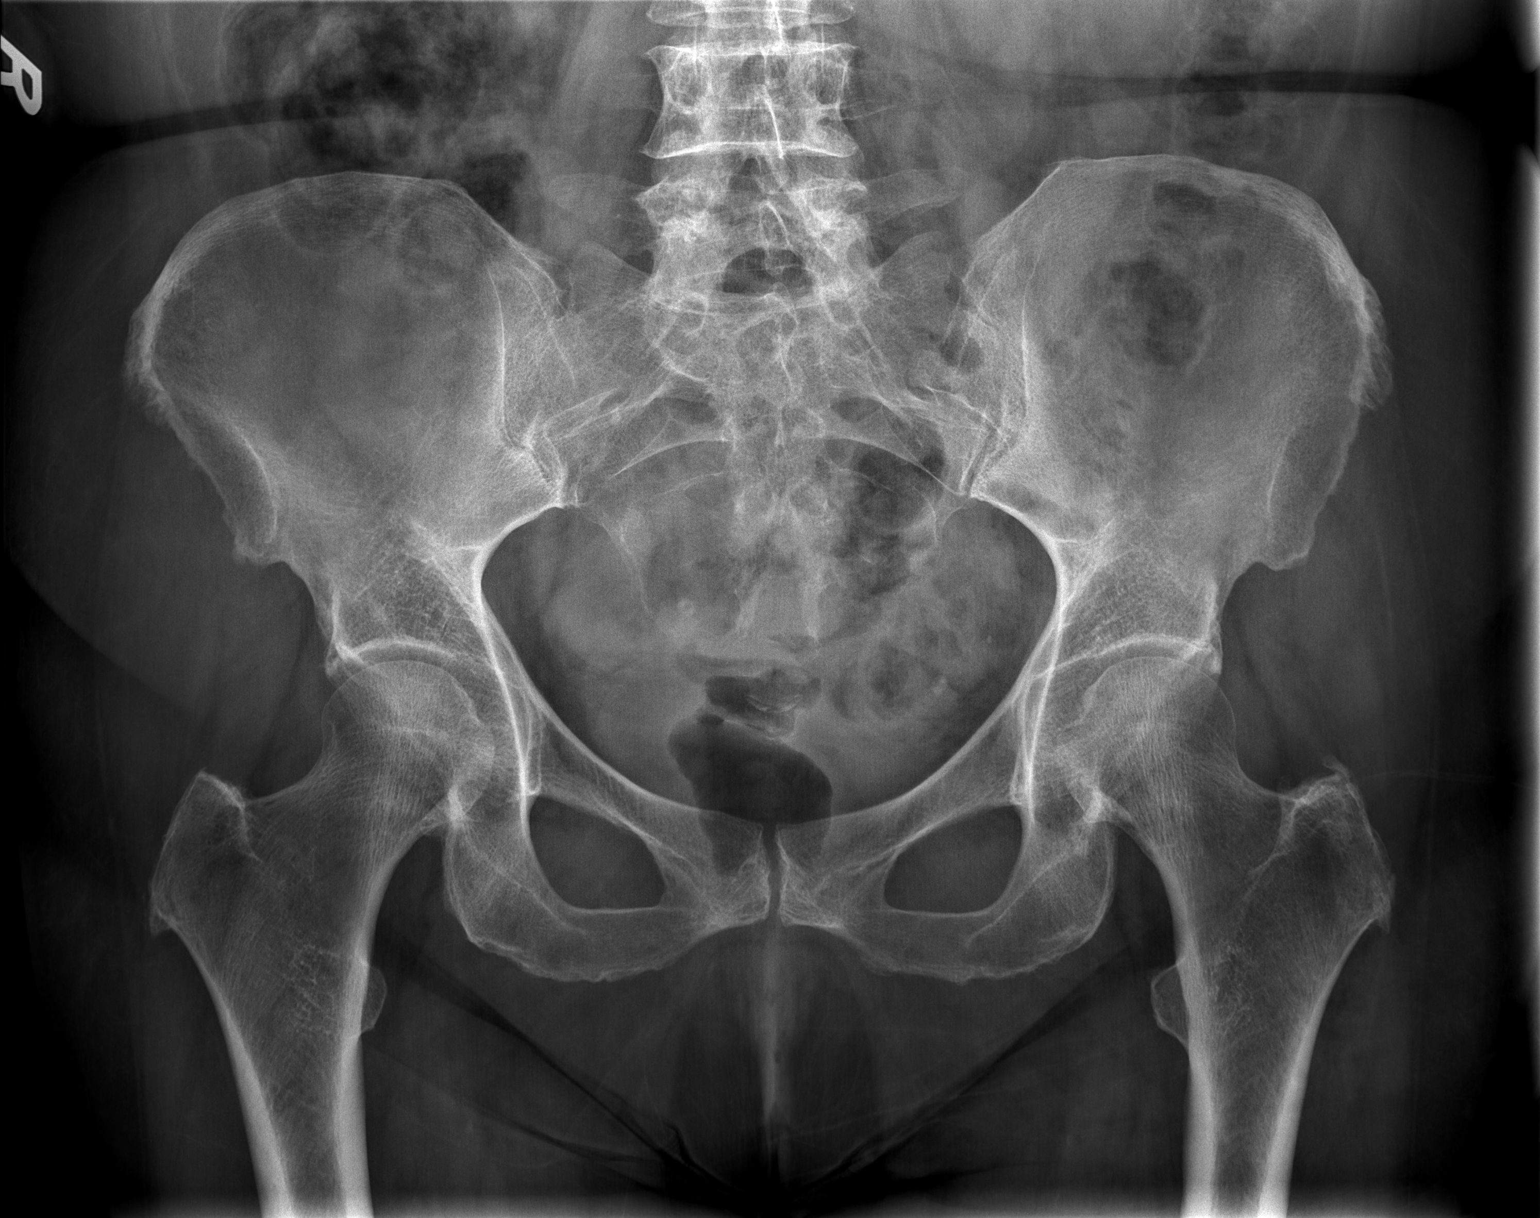

[t hip ap right]
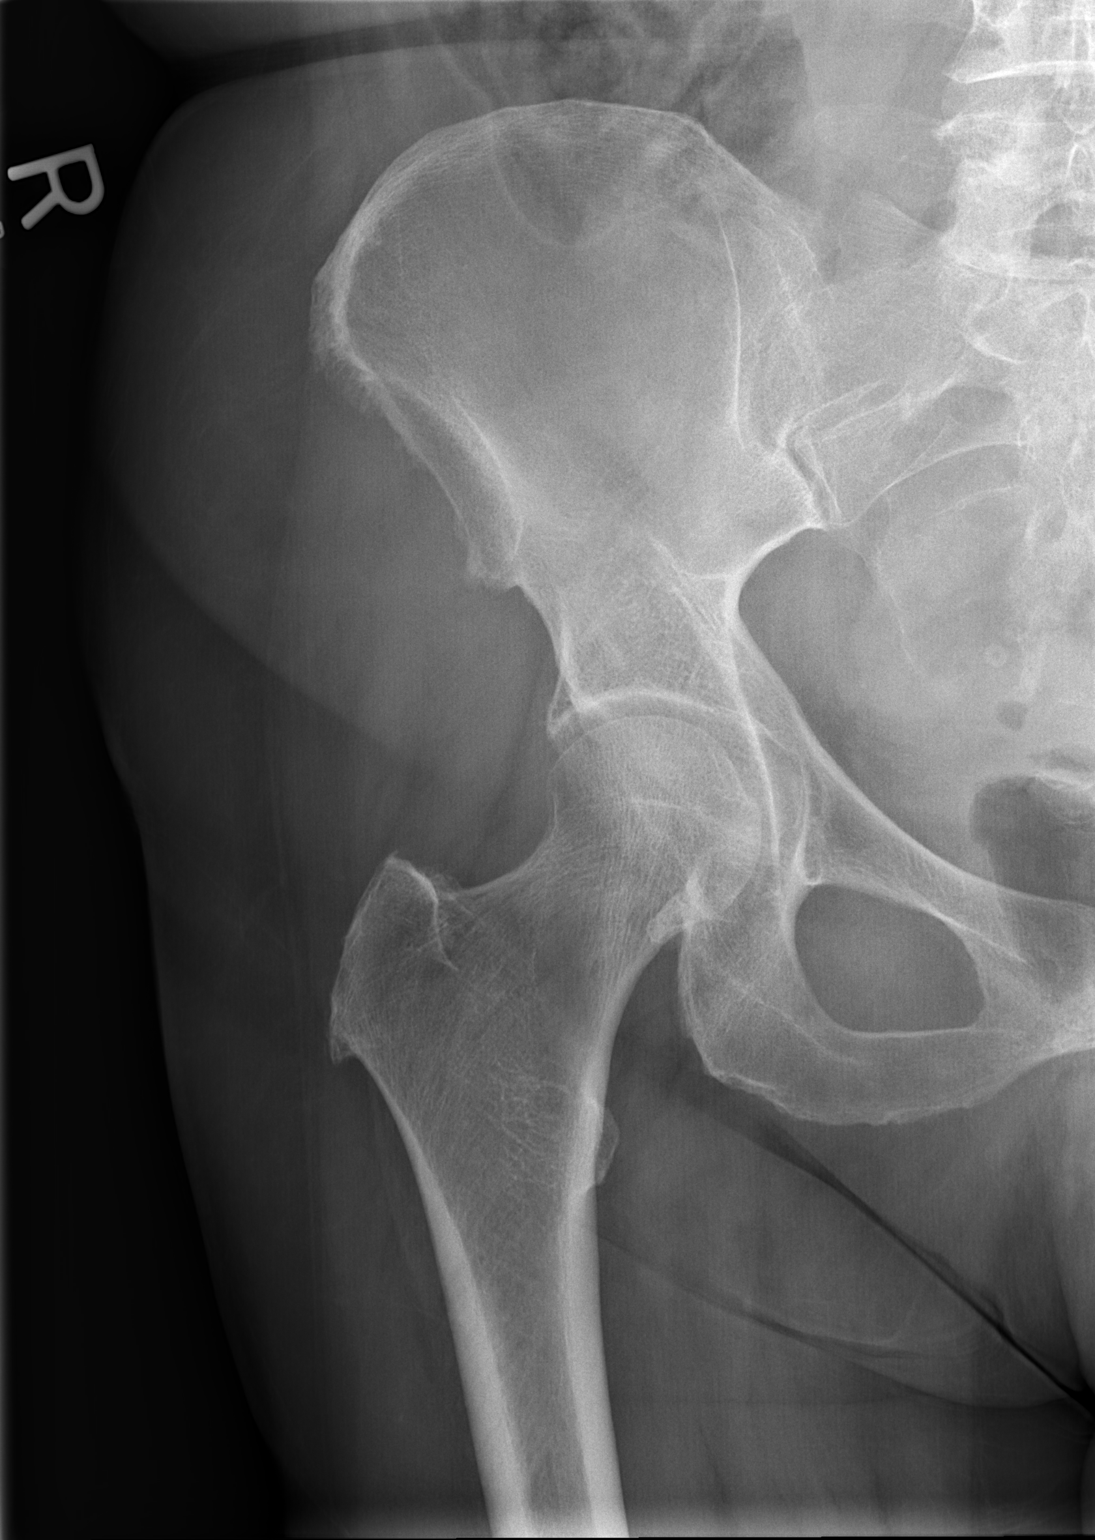

[t hip frog leg right]
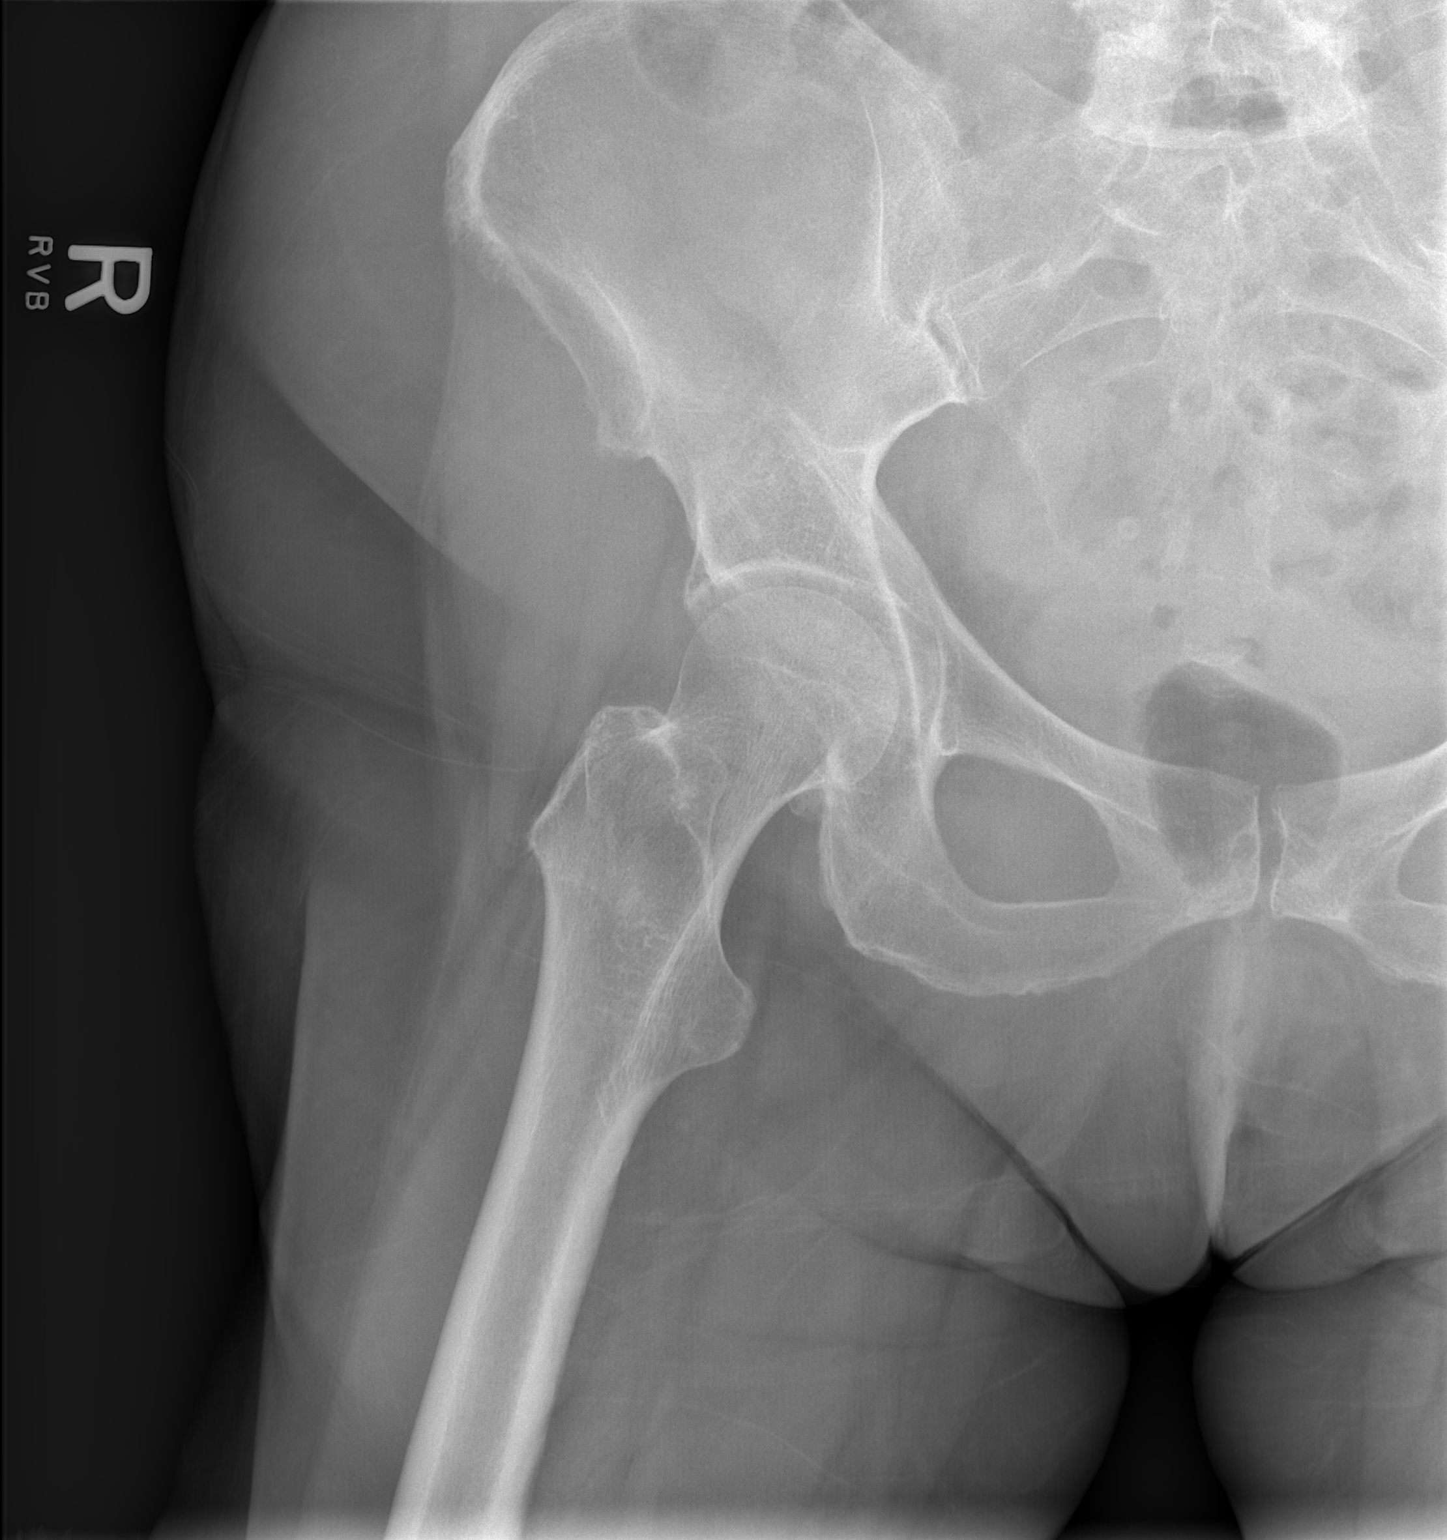

[3 of 3 positions shown; findings below may reference images not displayed]

FINDINGS: Single view of the pelvis and two views of the right hip are
provided. Osseous alignment is normal. No fracture line or displaced
fracture fragment seen. No significant degenerative change. Soft
tissues about the pelvis and right hip are unremarkable.
IMPRESSION: Negative.

## 2018-08-21 IMAGING — CR DG RIBS W/ CHEST 3+V*R*
4 series · 4 of 4 positions shown · non-contrast
Comparison: Chest x-ray dated 09/28/2013.

CLINICAL DATA: Pt c/o right > left body and back pain s/p slip,
fall on wet floor at work x 2 days ago. Previous neck surgery.

EXAM:
RIGHT RIBS AND CHEST - 3+ VIEW

[t ribs ap lower right]
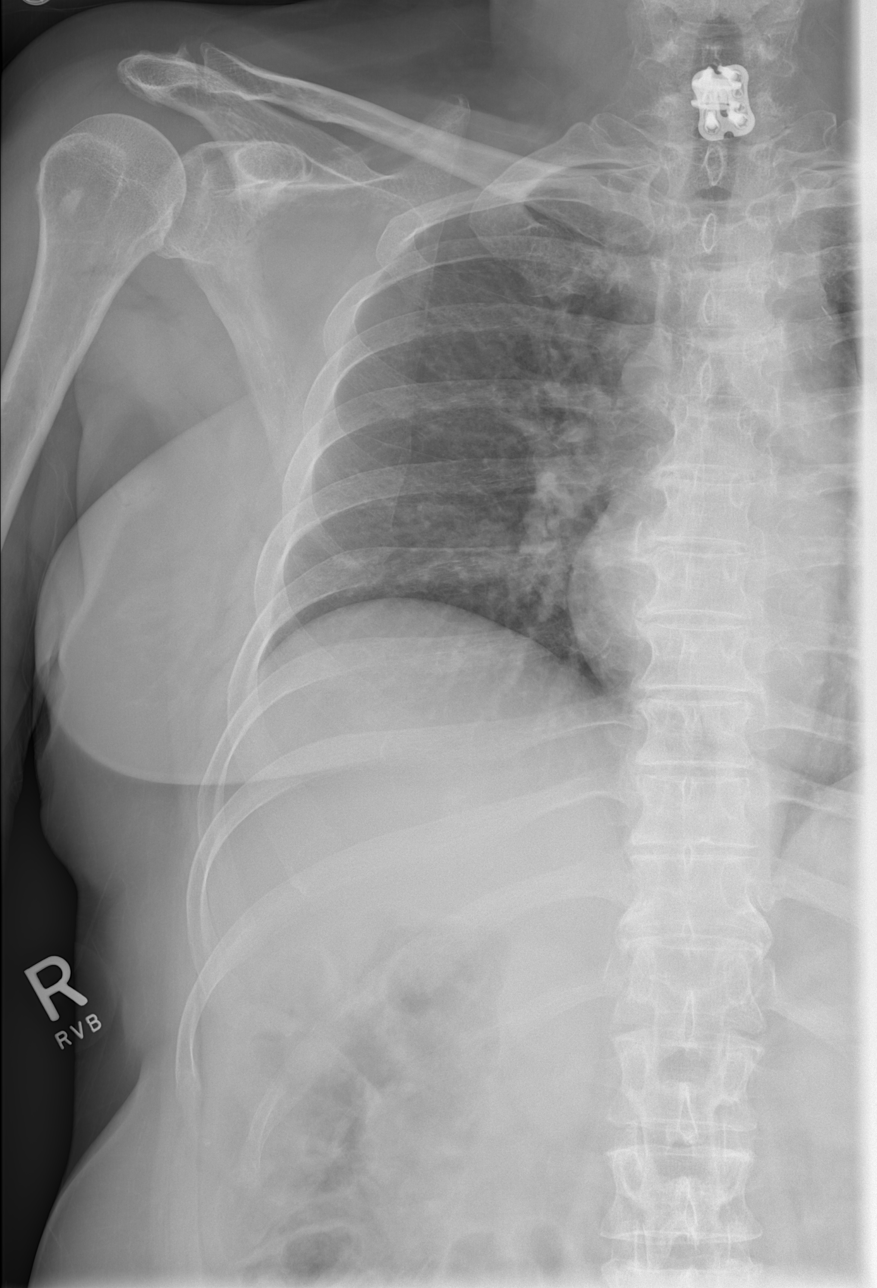

[t ribs ap upper right]
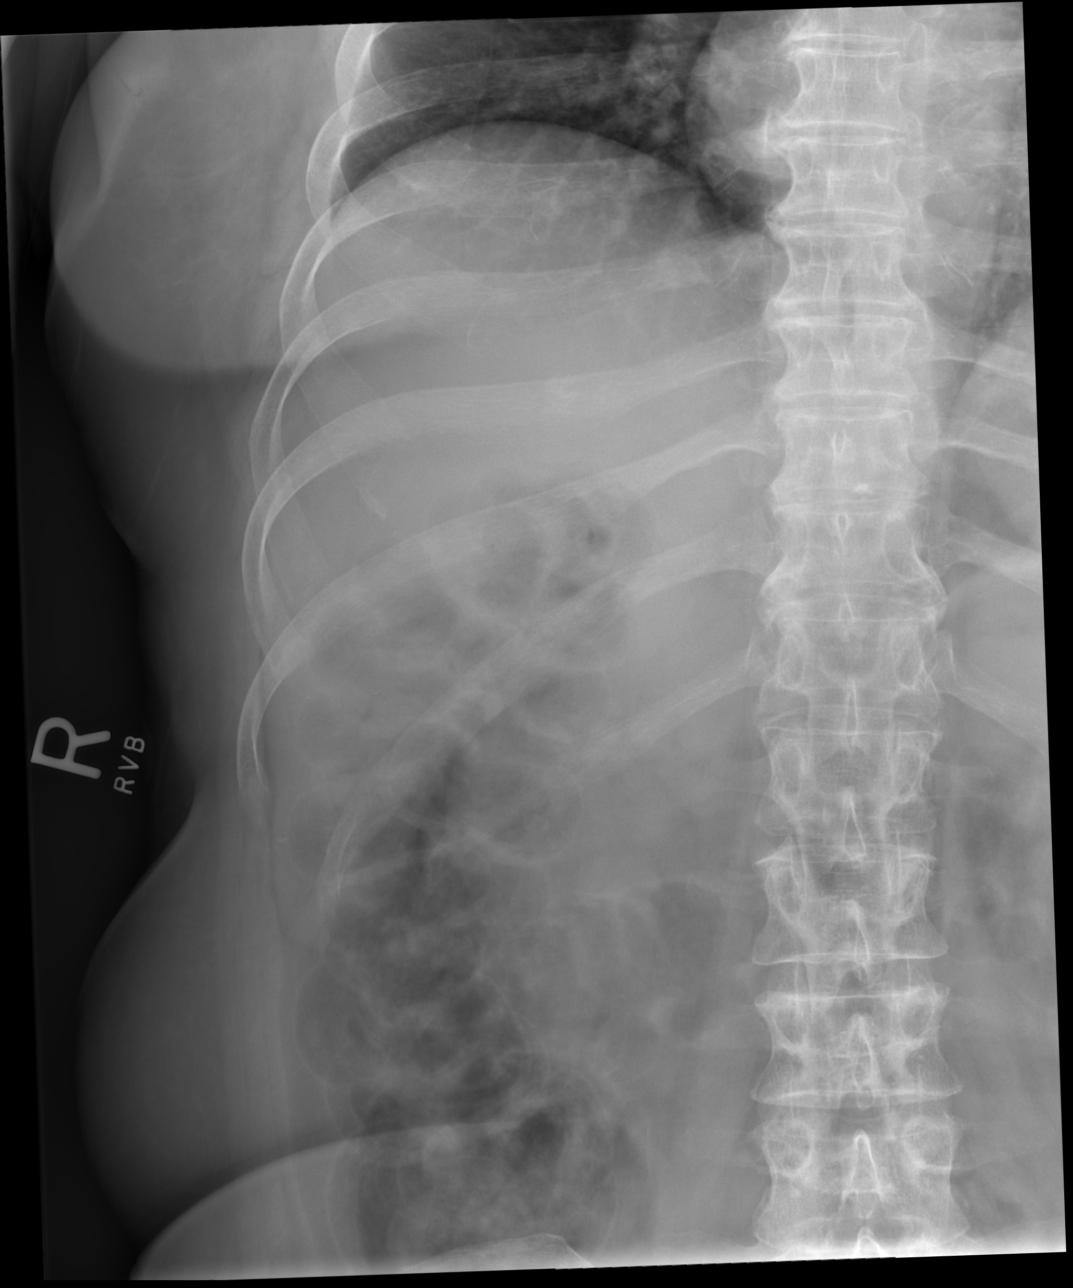

[t ribs rpo right]
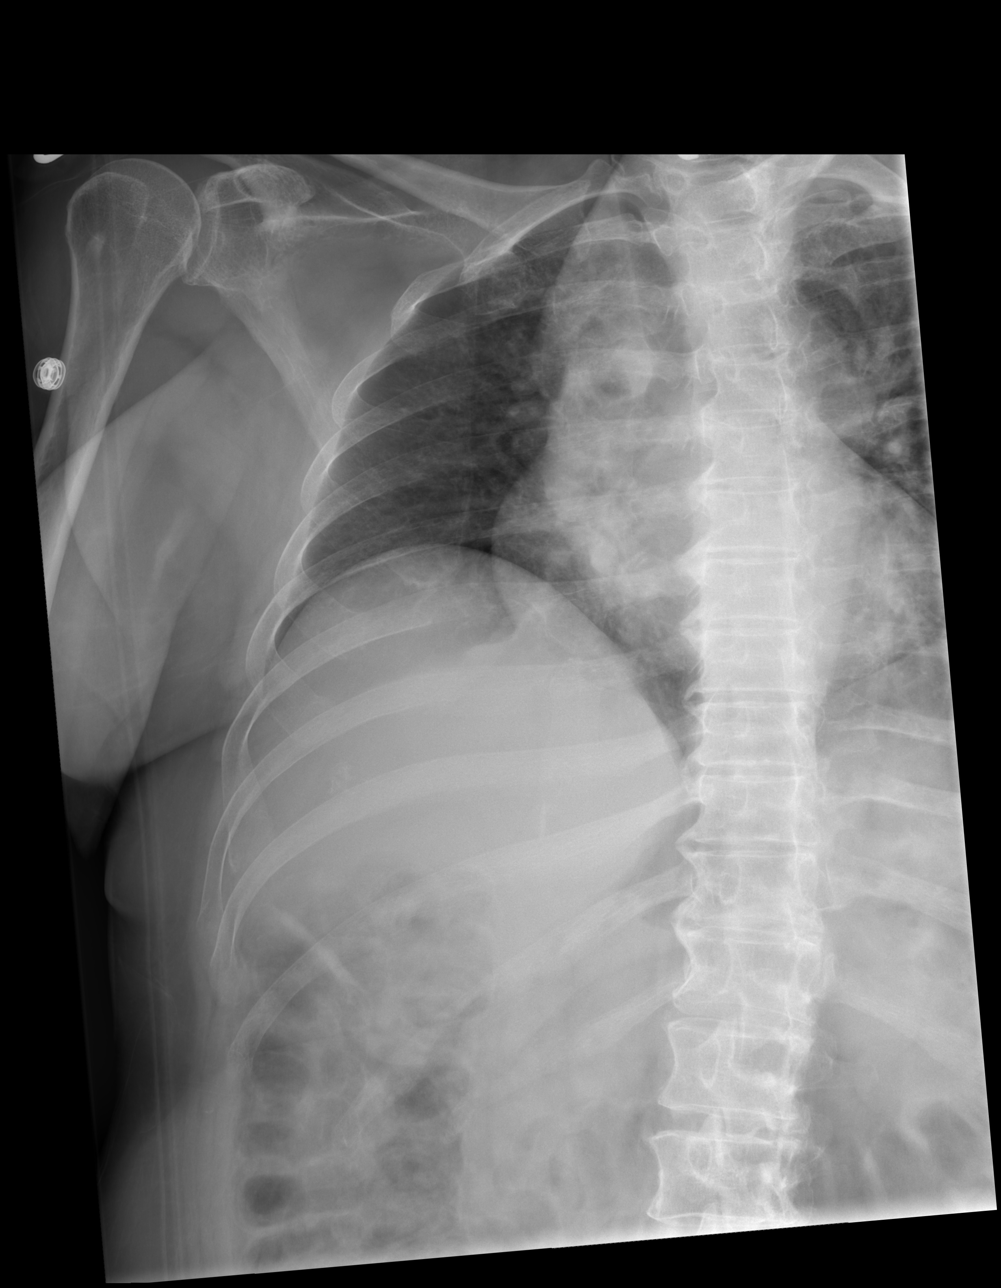

[w chest pa]
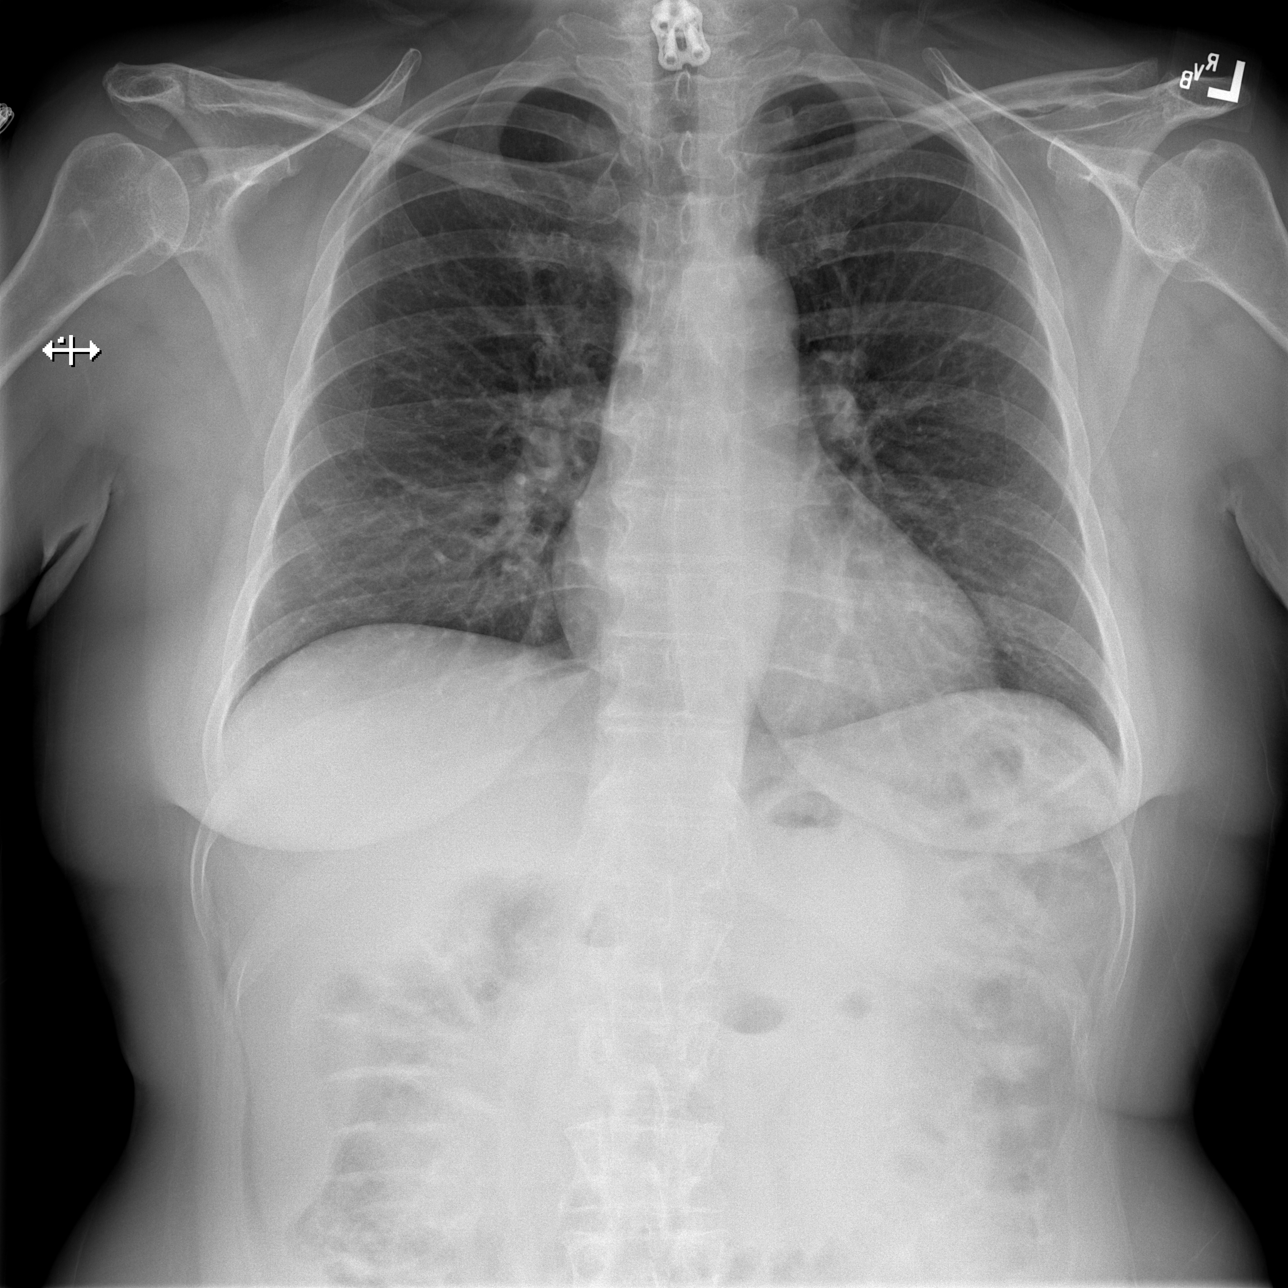

[4 of 4 positions shown; findings below may reference images not displayed]

FINDINGS: No fracture or other bone lesions are seen involving the ribs. There
is no evidence of pneumothorax or pleural effusion. Both lungs are
clear. Heart size and mediastinal contours are within normal limits.
IMPRESSION: Negative.

## 2018-08-27 DIAGNOSIS — L270 Generalized skin eruption due to drugs and medicaments taken internally: Secondary | ICD-10-CM | POA: Diagnosis not present

## 2018-09-04 DIAGNOSIS — R319 Hematuria, unspecified: Secondary | ICD-10-CM | POA: Diagnosis not present

## 2018-09-04 DIAGNOSIS — N390 Urinary tract infection, site not specified: Secondary | ICD-10-CM | POA: Diagnosis not present

## 2018-09-04 DIAGNOSIS — I10 Essential (primary) hypertension: Secondary | ICD-10-CM | POA: Diagnosis not present

## 2018-09-04 DIAGNOSIS — R21 Rash and other nonspecific skin eruption: Secondary | ICD-10-CM | POA: Diagnosis not present

## 2018-10-05 DIAGNOSIS — N390 Urinary tract infection, site not specified: Secondary | ICD-10-CM | POA: Diagnosis not present

## 2018-10-05 DIAGNOSIS — N3090 Cystitis, unspecified without hematuria: Secondary | ICD-10-CM | POA: Diagnosis not present

## 2019-04-12 DIAGNOSIS — Z0000 Encounter for general adult medical examination without abnormal findings: Secondary | ICD-10-CM | POA: Diagnosis not present

## 2019-04-12 DIAGNOSIS — Z7984 Long term (current) use of oral hypoglycemic drugs: Secondary | ICD-10-CM | POA: Diagnosis not present

## 2019-04-12 DIAGNOSIS — E1165 Type 2 diabetes mellitus with hyperglycemia: Secondary | ICD-10-CM | POA: Diagnosis not present

## 2019-04-12 DIAGNOSIS — R202 Paresthesia of skin: Secondary | ICD-10-CM | POA: Diagnosis not present

## 2019-04-12 DIAGNOSIS — Z79899 Other long term (current) drug therapy: Secondary | ICD-10-CM | POA: Diagnosis not present

## 2019-04-12 DIAGNOSIS — E7800 Pure hypercholesterolemia, unspecified: Secondary | ICD-10-CM | POA: Diagnosis not present

## 2019-04-12 DIAGNOSIS — Z23 Encounter for immunization: Secondary | ICD-10-CM | POA: Diagnosis not present

## 2019-06-20 ENCOUNTER — Ambulatory Visit: Payer: Self-pay

## 2019-06-20 ENCOUNTER — Ambulatory Visit: Payer: BC Managed Care – PPO | Attending: Internal Medicine

## 2019-06-20 DIAGNOSIS — Z23 Encounter for immunization: Secondary | ICD-10-CM

## 2019-06-20 NOTE — Progress Notes (Signed)
   Covid-19 Vaccination Clinic  Name:  Erica Turner    MRN: [id number] DOB: 1961-02-12  06/20/2019  Ms. Warshaw was observed post Covid-19 immunization for 30 minutes based on pre-vaccination screening without incident. She was provided with Vaccine Information Sheet and instruction to access the V-Safe system.   Ms. Saephan was instructed to call 911 with any severe reactions post vaccine: Marland Kitchen Difficulty breathing  . Swelling of face and throat  . A fast heartbeat  . A bad rash all over body  . Dizziness and weakness   Immunizations Administered    Name Date Dose VIS Date Route   Pfizer COVID-19 Vaccine 06/20/2019  5:22 PM 0.3 mL 03/26/2019 Intramuscular   Manufacturer: [employer], [employer]   Lot: [id number]   NDC: [address]-0

## 2019-07-11 ENCOUNTER — Ambulatory Visit: Payer: BC Managed Care – PPO | Attending: Internal Medicine

## 2019-07-11 DIAGNOSIS — Z23 Encounter for immunization: Secondary | ICD-10-CM

## 2019-07-11 NOTE — Progress Notes (Signed)
   Covid-19 Vaccination Clinic  Name:  Erica Turner    MRN: [id number] DOB: 1960/05/24  07/11/2019  [name] was observed post Covid-19 immunization for 30 minutes based on pre-vaccination screening without incident. She was provided with Vaccine Information Sheet and instruction to access the V-Safe system.   [name] was instructed to call 911 with any severe reactions post vaccine: Marland Kitchen Difficulty breathing  . Swelling of face and throat  . A fast heartbeat  . A bad rash all over body  . Dizziness and weakness   Immunizations Administered    Name Date Dose VIS Date Route   Pfizer COVID-19 Vaccine 07/11/2019  2:42 PM 0.3 mL 03/26/2019 Intramuscular   Manufacturer: [employer], [employer]   Lot: [id number]   NDC: [address]-1

## 2019-10-26 DIAGNOSIS — M76821 Posterior tibial tendinitis, right leg: Secondary | ICD-10-CM | POA: Diagnosis not present

## 2019-10-26 DIAGNOSIS — M79672 Pain in left foot: Secondary | ICD-10-CM | POA: Diagnosis not present

## 2019-10-26 DIAGNOSIS — M25571 Pain in right ankle and joints of right foot: Secondary | ICD-10-CM | POA: Diagnosis not present

## 2019-10-26 DIAGNOSIS — M79671 Pain in right foot: Secondary | ICD-10-CM | POA: Diagnosis not present

## 2019-11-01 DIAGNOSIS — M76821 Posterior tibial tendinitis, right leg: Secondary | ICD-10-CM | POA: Diagnosis not present

## 2019-11-15 DIAGNOSIS — M25571 Pain in right ankle and joints of right foot: Secondary | ICD-10-CM | POA: Diagnosis not present

## 2019-11-15 DIAGNOSIS — M76821 Posterior tibial tendinitis, right leg: Secondary | ICD-10-CM | POA: Diagnosis not present

## 2019-12-06 DIAGNOSIS — M6701 Short Achilles tendon (acquired), right ankle: Secondary | ICD-10-CM | POA: Diagnosis not present

## 2019-12-06 DIAGNOSIS — M76821 Posterior tibial tendinitis, right leg: Secondary | ICD-10-CM | POA: Diagnosis not present

## 2019-12-24 ENCOUNTER — Other Ambulatory Visit: Payer: Self-pay | Admitting: Emergency Medicine

## 2019-12-24 DIAGNOSIS — IMO0001 Reserved for inherently not codable concepts without codable children: Secondary | ICD-10-CM

## 2019-12-28 ENCOUNTER — Ambulatory Visit
Admission: RE | Admit: 2019-12-28 | Discharge: 2019-12-28 | Disposition: A | Discharge status: Home / Self Care | Payer: Worker's Compensation | Source: Ambulatory Visit | Attending: Emergency Medicine | Admitting: Emergency Medicine

## 2019-12-28 ENCOUNTER — Other Ambulatory Visit: Payer: Self-pay

## 2019-12-28 DIAGNOSIS — IMO0001 Reserved for inherently not codable concepts without codable children: Secondary | ICD-10-CM

## 2020-02-26 DIAGNOSIS — M25571 Pain in right ankle and joints of right foot: Secondary | ICD-10-CM | POA: Diagnosis not present

## 2020-03-03 DIAGNOSIS — M2021 Hallux rigidus, right foot: Secondary | ICD-10-CM | POA: Diagnosis not present

## 2020-03-03 DIAGNOSIS — M76821 Posterior tibial tendinitis, right leg: Secondary | ICD-10-CM | POA: Diagnosis not present

## 2020-03-03 DIAGNOSIS — M6701 Short Achilles tendon (acquired), right ankle: Secondary | ICD-10-CM | POA: Diagnosis not present

## 2020-03-16 DIAGNOSIS — M76821 Posterior tibial tendinitis, right leg: Secondary | ICD-10-CM | POA: Diagnosis not present

## 2020-03-16 DIAGNOSIS — M25571 Pain in right ankle and joints of right foot: Secondary | ICD-10-CM | POA: Diagnosis not present

## 2020-03-17 DIAGNOSIS — F329 Major depressive disorder, single episode, unspecified: Secondary | ICD-10-CM | POA: Diagnosis not present

## 2020-03-17 DIAGNOSIS — E119 Type 2 diabetes mellitus without complications: Secondary | ICD-10-CM | POA: Diagnosis not present

## 2020-03-17 DIAGNOSIS — Z01811 Encounter for preprocedural respiratory examination: Secondary | ICD-10-CM | POA: Diagnosis not present

## 2020-03-17 DIAGNOSIS — Z79899 Other long term (current) drug therapy: Secondary | ICD-10-CM | POA: Diagnosis not present

## 2020-03-17 DIAGNOSIS — I10 Essential (primary) hypertension: Secondary | ICD-10-CM | POA: Diagnosis not present

## 2020-03-28 DIAGNOSIS — S93421A Sprain of deltoid ligament of right ankle, initial encounter: Secondary | ICD-10-CM | POA: Diagnosis not present

## 2020-03-28 DIAGNOSIS — M205X1 Other deformities of toe(s) (acquired), right foot: Secondary | ICD-10-CM | POA: Diagnosis not present

## 2020-03-28 DIAGNOSIS — G8918 Other acute postprocedural pain: Secondary | ICD-10-CM | POA: Diagnosis not present

## 2020-03-28 DIAGNOSIS — M76821 Posterior tibial tendinitis, right leg: Secondary | ICD-10-CM | POA: Diagnosis not present

## 2020-03-28 DIAGNOSIS — M6701 Short Achilles tendon (acquired), right ankle: Secondary | ICD-10-CM | POA: Diagnosis not present

## 2020-03-28 DIAGNOSIS — X58XXXA Exposure to other specified factors, initial encounter: Secondary | ICD-10-CM | POA: Diagnosis not present

## 2020-03-28 DIAGNOSIS — Y999 Unspecified external cause status: Secondary | ICD-10-CM | POA: Diagnosis not present

## 2020-03-28 DIAGNOSIS — M2021 Hallux rigidus, right foot: Secondary | ICD-10-CM | POA: Diagnosis not present

## 2020-04-06 DIAGNOSIS — M76821 Posterior tibial tendinitis, right leg: Secondary | ICD-10-CM | POA: Diagnosis not present

## 2020-05-12 DIAGNOSIS — M76821 Posterior tibial tendinitis, right leg: Secondary | ICD-10-CM | POA: Diagnosis not present

## 2020-06-07 DIAGNOSIS — M76821 Posterior tibial tendinitis, right leg: Secondary | ICD-10-CM | POA: Diagnosis not present

## 2020-06-08 DIAGNOSIS — M13841 Other specified arthritis, right hand: Secondary | ICD-10-CM | POA: Diagnosis not present

## 2020-06-08 DIAGNOSIS — M79644 Pain in right finger(s): Secondary | ICD-10-CM | POA: Diagnosis not present

## 2020-06-19 DIAGNOSIS — M25571 Pain in right ankle and joints of right foot: Secondary | ICD-10-CM | POA: Diagnosis not present

## 2020-06-19 DIAGNOSIS — M76821 Posterior tibial tendinitis, right leg: Secondary | ICD-10-CM | POA: Diagnosis not present

## 2020-06-28 DIAGNOSIS — M25571 Pain in right ankle and joints of right foot: Secondary | ICD-10-CM | POA: Diagnosis not present

## 2020-07-03 DIAGNOSIS — M25571 Pain in right ankle and joints of right foot: Secondary | ICD-10-CM | POA: Diagnosis not present

## 2020-07-07 DIAGNOSIS — M76821 Posterior tibial tendinitis, right leg: Secondary | ICD-10-CM | POA: Diagnosis not present

## 2020-07-18 ENCOUNTER — Other Ambulatory Visit: Payer: Self-pay | Admitting: Family Medicine

## 2020-07-18 ENCOUNTER — Other Ambulatory Visit (HOSPITAL_COMMUNITY)
Admission: RE | Admit: 2020-07-18 | Discharge: 2020-07-18 | Disposition: A | Discharge status: Home / Self Care | Payer: BC Managed Care – PPO | Source: Ambulatory Visit | Attending: Family Medicine | Admitting: Family Medicine

## 2020-07-18 DIAGNOSIS — Z0000 Encounter for general adult medical examination without abnormal findings: Secondary | ICD-10-CM | POA: Diagnosis not present

## 2020-07-18 DIAGNOSIS — E1165 Type 2 diabetes mellitus with hyperglycemia: Secondary | ICD-10-CM | POA: Diagnosis not present

## 2020-07-18 DIAGNOSIS — Z01411 Encounter for gynecological examination (general) (routine) with abnormal findings: Secondary | ICD-10-CM | POA: Diagnosis not present

## 2020-07-18 DIAGNOSIS — Z7984 Long term (current) use of oral hypoglycemic drugs: Secondary | ICD-10-CM | POA: Diagnosis not present

## 2020-07-18 DIAGNOSIS — Z79899 Other long term (current) drug therapy: Secondary | ICD-10-CM | POA: Diagnosis not present

## 2020-07-18 DIAGNOSIS — Z1211 Encounter for screening for malignant neoplasm of colon: Secondary | ICD-10-CM | POA: Diagnosis not present

## 2020-07-21 LAB — CYTOLOGY - PAP
Comment: NEGATIVE
Diagnosis: NEGATIVE
High risk HPV: NEGATIVE

## 2020-08-11 DIAGNOSIS — Z1211 Encounter for screening for malignant neoplasm of colon: Secondary | ICD-10-CM | POA: Diagnosis not present

## 2020-08-14 DIAGNOSIS — M76821 Posterior tibial tendinitis, right leg: Secondary | ICD-10-CM | POA: Diagnosis not present

## 2021-01-13 DIAGNOSIS — U071 COVID-19: Secondary | ICD-10-CM

## 2021-01-13 HISTORY — DX: COVID-19: U07.1

## 2021-07-30 ENCOUNTER — Emergency Department (HOSPITAL_BASED_OUTPATIENT_CLINIC_OR_DEPARTMENT_OTHER)
Admission: EM | Admit: 2021-07-30 | Discharge: 2021-07-30 | Disposition: A | Discharge status: Home / Self Care | Payer: Managed Care, Other (non HMO) | Attending: Emergency Medicine | Admitting: Emergency Medicine

## 2021-07-30 ENCOUNTER — Emergency Department (HOSPITAL_BASED_OUTPATIENT_CLINIC_OR_DEPARTMENT_OTHER): Payer: Managed Care, Other (non HMO)

## 2021-07-30 ENCOUNTER — Other Ambulatory Visit: Payer: Self-pay

## 2021-07-30 ENCOUNTER — Encounter (HOSPITAL_BASED_OUTPATIENT_CLINIC_OR_DEPARTMENT_OTHER): Payer: Self-pay

## 2021-07-30 ENCOUNTER — Emergency Department (HOSPITAL_COMMUNITY): Payer: Managed Care, Other (non HMO)

## 2021-07-30 DIAGNOSIS — E119 Type 2 diabetes mellitus without complications: Secondary | ICD-10-CM | POA: Insufficient documentation

## 2021-07-30 DIAGNOSIS — Z7984 Long term (current) use of oral hypoglycemic drugs: Secondary | ICD-10-CM | POA: Diagnosis not present

## 2021-07-30 DIAGNOSIS — Z79899 Other long term (current) drug therapy: Secondary | ICD-10-CM | POA: Insufficient documentation

## 2021-07-30 DIAGNOSIS — H43393 Other vitreous opacities, bilateral: Secondary | ICD-10-CM

## 2021-07-30 DIAGNOSIS — R519 Headache, unspecified: Secondary | ICD-10-CM

## 2021-07-30 DIAGNOSIS — Z7982 Long term (current) use of aspirin: Secondary | ICD-10-CM | POA: Insufficient documentation

## 2021-07-30 DIAGNOSIS — I10 Essential (primary) hypertension: Secondary | ICD-10-CM | POA: Insufficient documentation

## 2021-07-30 DIAGNOSIS — H5340 Unspecified visual field defects: Secondary | ICD-10-CM | POA: Diagnosis present

## 2021-07-30 LAB — COMPREHENSIVE METABOLIC PANEL
ALT: 23 U/L (ref 0–44)
AST: 16 U/L (ref 15–41)
Albumin: 4.3 g/dL (ref 3.5–5.0)
Alkaline Phosphatase: 70 U/L (ref 38–126)
Anion gap: 8 (ref 5–15)
BUN: 11 mg/dL (ref 8–23)
CO2: 25 mmol/L (ref 22–32)
Calcium: 9.5 mg/dL (ref 8.9–10.3)
Chloride: 105 mmol/L (ref 98–111)
Creatinine, Ser: 0.61 mg/dL (ref 0.44–1.00)
GFR, Estimated: >60 mL/min (ref >60)
Glucose, Bld: 125 mg/dL — ABNORMAL HIGH (ref 70–99)
Potassium: 3.7 mmol/L (ref 3.5–5.1)
Sodium: 138 mmol/L (ref 135–145)
Total Bilirubin: 0.6 mg/dL (ref 0.3–1.2)
Total Protein: 8 g/dL (ref 6.5–8.1)

## 2021-07-30 LAB — CBC
HCT: 45.4 % (ref 36.0–46.0)
Hemoglobin: 15.5 g/dL — ABNORMAL HIGH (ref 12.0–15.0)
MCH: 30.1 pg (ref 26.0–34.0)
MCHC: 34.1 g/dL (ref 30.0–36.0)
MCV: 88.2 fL (ref 80.0–100.0)
Platelets: 303 10*3/uL (ref 150–400)
RBC: 5.15 MIL/uL — ABNORMAL HIGH (ref 3.87–5.11)
RDW: 13 % (ref 11.5–15.5)
WBC: 7.9 10*3/uL (ref 4.0–10.5)
nRBC: 0 % (ref 0.0–0.2)

## 2021-07-30 LAB — CBG MONITORING, ED: Glucose-Capillary: 112 mg/dL — ABNORMAL HIGH (ref 70–99)

## 2021-07-30 MED ORDER — METOCLOPRAMIDE HCL 10 MG PO TABS: 10.0000 mg | ORAL_TABLET | Freq: Four times a day (QID) | ORAL | 0 refills | Status: DC | PRN

## 2021-07-30 MED ORDER — LORAZEPAM 2 MG/ML IJ SOLN
1.0000 mg | Freq: Once | INTRAMUSCULAR | Status: AC | PRN
  Administered 2021-07-30: 1 mg via INTRAVENOUS
  Filled 2021-07-30: qty 1

## 2021-07-30 MED ORDER — IOHEXOL 350 MG/ML SOLN
100.0000 mL | Freq: Once | INTRAVENOUS | Status: AC | PRN
  Administered 2021-07-30: 100 mL via INTRAVENOUS

## 2021-07-30 NOTE — ED Notes (Signed)
Patient verbalizes understanding of discharge instructions. Opportunity for questioning and answers were provided. Armband removed by staff, pt discharged from ED.  

## 2021-07-30 NOTE — ED Provider Notes (Signed)
?  Physical Exam  ?BP (!) 142/89   Pulse 72   Temp 97.7 ?F (36.5 ?C)   Resp 18   Ht 5\' 3"  (1.6 m)   Wt 68 kg   SpO2 99%   BMI 26.57 kg/m?  ? ?Physical Exam ? ?Procedures  ?Procedures ? ?ED Course / MDM  ? ?Clinical Course as of 07/30/21 1741  ?Mon Jul 30, 2021  ?1119 Hemoglobin(!): 15.5 [HK]  ?1125 Creatinine: 0.61 [HK]  ?  ?Clinical Course User Index ?[HK] 1120, PA-C  ? ?Medical Decision Making ?Patient was transferred from med center for MRI.  I reviewed MRI did not show stroke.  Patient has frequent headaches at least 2-3 times a week now.  Will refer to neurology outpatient but will give Reglan as needed ? ?Amount and/or Complexity of Data Reviewed ?Labs: ordered. Decision-making details documented in ED Course. ?Radiology: ordered and independent interpretation performed. Decision-making details documented in ED Course. ? ?Risk ?Prescription drug management. ? ? ? ? ? ? ? ?  ?Dietrich Pates, MD ?07/30/21 1746 ? ?

## 2021-07-30 NOTE — ED Notes (Signed)
Report given to Efraim Kaufmann, RN at [facility] ER. ?

## 2021-07-30 NOTE — ED Notes (Signed)
Patient transported to MRI 

## 2021-07-30 NOTE — Discharge Instructions (Signed)
You likely have migraine headaches. ? ?Continue taking Tylenol or Motrin or Excedrin for headaches. ? ?You can take Reglan for severe headache ? ?You need to follow-up with a neurologist.  We have referred you to neurology but if they do not call you in a day or 2, please call the number for the clinic ? ?You may also want to talk to your eye doctor for a full eye exam ? ?Return to ER if you have worse headache, blurry vision, vomiting, fever ?

## 2021-07-30 NOTE — ED Triage Notes (Signed)
Pt arrives ambulatory to ED with reports of having visual changes starting on Saturday while driving states that her vision has been coming in and out. Reports she did not think much about it because she sometimes has visual changes prior to migraines. Daughter reports she has had neuro symptoms for some time. Pt also is diabetic, daughter reports she blames her symptoms on her migraines and diabetes. Pt reports seeing long floaters in visual field. Also having weakness to the right side with numbness to the face, pt states this is also common for her migraines.  ?

## 2021-07-30 NOTE — ED Provider Notes (Signed)
?[facility] EMERGENCY DEPARTMENT ?Provider Note ? ? ?CSN: [id number] ?Arrival date & time: 07/30/21  1018 ? ?  ? ?History ? ?Chief Complaint  ?Patient presents with  ? Visual Field Change  ? ? ?Erica Turner is a 61 y.o. female with a past medical history of hypertension, migraines, diabetes presenting to the ED with a chief complaint of visual field change.  Patient explains that for the past several decades she has experienced migraines with associated "tunnel vision" loss of vision, numbness and weakness which she all attributed to her migraines.  However since 07/28/2021 she has been experiencing "floaters" that she describes as a crescent shape in both of her eyes and "trouble focusing on something in front of me" as well as right-sided weakness in her hand.  When this happened on 07/28/2021 it was associated with a headache and all of her symptoms improved after taking Tylenol.  Yesterday she again had weakness and floaters although did not have a headache.  She took Tylenol after experiencing the weakness and floaters and believes that she not develop a headache because she took the Tylenol.  She again experiences the weakness and floaters this morning but has not yet developed a headache.  She took Tylenol this morning.  Her visual symptoms have improved but she is still experiencing residual right hand weakness. ? ?She has never been evaluated for her neurological symptoms in the past.  She denies any current headache, loss of sensation in her extremities, changes to her gait, vomiting, nausea, fever, neck stiffness, chest pain, shortness of breath. ? ?She was put on Ozempic and [place] by her PCP approximately 8 weeks ago and she feels that it has helped to control her blood sugars.  She has been compliant with her other medications as well. ? ?HPI ? ?  ? ?Home Medications ?Prior to Admission medications   ?Medication Sig Start Date End Date Taking? Authorizing Provider  ?acetaminophen (TYLENOL)  500 MG tablet Take 1,000 mg by mouth every 8 (eight) hours as needed (for pain).     [provider]  ?ALPRAZolam Duanne Moron) 0.5 MG tablet Take 0.25-0.5 mg by mouth 3 (three) times daily as needed for anxiety.    [provider]  ?amLODipine (NORVASC) 10 MG tablet Take 10 mg by mouth daily.    [provider]  ?aspirin EC 325 MG tablet Take 325 mg by mouth daily.    [provider]  ?atorvastatin (LIPITOR) 40 MG tablet Take 40 mg by mouth daily.    [provider]  ?cyclobenzaprine (FLEXERIL) 10 MG tablet Take 1 tablet (10 mg total) by mouth 2 (two) times daily as needed for muscle spasms. 08/25/16   Doristine Devoid, PA-C  ?fluticasone (FLONASE) 50 MCG/ACT nasal spray Place 1 spray into both nostrils daily as needed for allergies or rhinitis.    [provider]  ?glipiZIDE (GLUCOTROL XL) 5 MG 24 hr tablet Take 5 mg by mouth daily with breakfast.    [provider]  ?ibuprofen (ADVIL,MOTRIN) 200 MG tablet Take 400 mg by mouth every 6 (six) hours as needed for fever, headache, mild pain, moderate pain or cramping.    [provider]  ?metFORMIN (GLUCOPHAGE-XR) 500 MG 24 hr tablet Take 1,000 mg by mouth 2 (two) times daily.     [provider]  ?naproxen (NAPROSYN) 375 MG tablet Take 1 tablet (375 mg total) by mouth 2 (two) times daily. 08/25/16   Doristine Devoid, PA-C  ?naproxen sodium (  ANAPROX) 220 MG tablet Take 660 mg by mouth every 12 (twelve) hours as needed (for pain).    [provider]  ?valsartan (DIOVAN) 80 MG tablet Take 80 mg by mouth daily.    [provider]  ?   ? ?Allergies    ?Lisinopril, Pneumococcal vaccines, and Darvon [propoxyphene]   ? ?Review of Systems   ?Review of Systems  ?Constitutional:  Negative for appetite change, chills and fever.  ?HENT:  Negative for ear pain, rhinorrhea, sneezing and sore throat.   ?Eyes:  Positive for visual disturbance. Negative for photophobia.  ?Respiratory:   Negative for cough, chest tightness, shortness of breath and wheezing.   ?Cardiovascular:  Negative for chest pain and palpitations.  ?Gastrointestinal:  Negative for abdominal pain, blood in stool, constipation, diarrhea, nausea and vomiting.  ?Genitourinary:  Negative for dysuria, hematuria and urgency.  ?Musculoskeletal:  Negative for myalgias.  ?Skin:  Negative for rash.  ?Neurological:  Positive for weakness. Negative for dizziness and light-headedness.  ? ?Physical Exam ?Updated Vital Signs ?BP (!) 150/79   Pulse 75   Temp 98 ?F (36.7 ?C) (Oral)   Resp 12   Ht 5\' 3"  (1.6 m)   Wt 68 kg   SpO2 99%   BMI 26.57 kg/m?  ?Physical Exam ?Vitals and nursing note reviewed.  ?Constitutional:   ?   General: She is not in acute distress. ?   Appearance: She is well-developed.  ?HENT:  ?   Head: Normocephalic and atraumatic.  ?   Nose: Nose normal.  ?Eyes:  ?   General: No scleral icterus.    ?   Right eye: No discharge.     ?   Left eye: No discharge.  ?   Conjunctiva/sclera: Conjunctivae normal.  ?   Pupils: Pupils are equal, round, and reactive to light.  ?Cardiovascular:  ?   Rate and Rhythm: Normal rate and regular rhythm.  ?   Heart sounds: Normal heart sounds. No murmur heard. ?  No friction rub. No gallop.  ?Pulmonary:  ?   Effort: Pulmonary effort is normal. No respiratory distress.  ?   Breath sounds: Normal breath sounds.  ?Abdominal:  ?   General: Bowel sounds are normal. There is no distension.  ?   Palpations: Abdomen is soft.  ?   Tenderness: There is no abdominal tenderness. There is no guarding.  ?Musculoskeletal:     ?   General: Normal range of motion.  ?   Cervical back: Normal range of motion and neck supple.  ?Skin: ?   General: Skin is warm and dry.  ?   Findings: No rash.  ?Neurological:  ?   General: No focal deficit present.  ?   Mental Status: She is alert and oriented to person, place, and time.  ?   Cranial Nerves: No cranial nerve deficit.  ?   Sensory: No sensory deficit.  ?   Motor: No  weakness or abnormal muscle tone.  ?   Coordination: Coordination normal.  ?   Comments: Pupils reactive. No facial asymmetry noted. Cranial nerves appear grossly intact. Sensation intact to light touch on face, BUE and BLE. Very slightly decreased strength in RUE. Strength 5/5 in LUE, BLE. No aphasia. Normal gait. Normal coordination.  ? ? ?ED Results / Procedures / Treatments   ?Labs ?(all labs ordered are listed, but only abnormal results are displayed) ?Labs Reviewed  ?CBC - Abnormal; Notable for the following components:  ?    Result  Value  ? RBC 5.15 (*)   ? Hemoglobin 15.5 (*)   ? All other components within normal limits  ?COMPREHENSIVE METABOLIC PANEL - Abnormal; Notable for the following components:  ? Glucose, Bld 125 (*)   ? All other components within normal limits  ?CBG MONITORING, ED - Abnormal; Notable for the following components:  ? Glucose-Capillary 112 (*)   ? All other components within normal limits  ? ? ?EKG ?EKG Interpretation ? ?Date/Time:  Monday July 30 2021 10:30:41 EDT ?Ventricular Rate:  74 ?PR Interval:  152 ?QRS Duration: 96 ?QT Interval:  388 ?QTC Calculation: 431 ?R Axis:   17 ?Text Interpretation: Sinus rhythm Confirmed by Lennice Sites ([id number]) on 07/30/2021 10:33:36 AM ? ?Radiology ?CT ANGIO HEAD NECK W WO CM ? ?Result Date: 07/30/2021 ?CLINICAL DATA:  Neuro deficit, acute, stroke suspected EXAM: CT ANGIOGRAPHY HEAD AND NECK TECHNIQUE: Multidetector CT imaging of the head and neck was performed using the standard protocol during bolus administration of intravenous contrast. Multiplanar CT image reconstructions and MIPs were obtained to evaluate the vascular anatomy. Carotid stenosis measurements (when applicable) are obtained utilizing NASCET criteria, using the distal internal carotid diameter as the denominator. RADIATION DOSE REDUCTION: This exam was performed according to the departmental dose-optimization program which includes automated exposure control, adjustment of the mA  and/or kV according to patient size and/or use of iterative reconstruction technique. CONTRAST:  131mL OMNIPAQUE IOHEXOL 350 MG/ML SOLN COMPARISON:  CT head 11/25/2016. FINDINGS: CT HEAD FINDINGS Brain: No evid

## 2021-07-30 NOTE — ED Notes (Signed)
Pt transported to CT ?

## 2021-08-21 ENCOUNTER — Encounter: Payer: Self-pay | Admitting: Neurology

## 2021-08-21 ENCOUNTER — Ambulatory Visit: Payer: Managed Care, Other (non HMO) | Admitting: Neurology

## 2021-08-21 VITALS — BP 193/98 | HR 64 | Ht 63.75 in | Wt 151.8 lb

## 2021-08-21 DIAGNOSIS — G459 Transient cerebral ischemic attack, unspecified: Secondary | ICD-10-CM | POA: Diagnosis not present

## 2021-08-21 DIAGNOSIS — G43109 Migraine with aura, not intractable, without status migrainosus: Secondary | ICD-10-CM

## 2021-08-21 DIAGNOSIS — R531 Weakness: Secondary | ICD-10-CM | POA: Diagnosis not present

## 2021-08-21 NOTE — Patient Instructions (Addendum)
Having unusual symptoms, has risk factors for stroke including: diabetes, remote(and recent) hx of migraines with aura, HTN and age (HgbA1c was around 9 per patient 3 months ago, just had another one repeated with Dr. Paulino RilyWolters), HLD? (She just had a lipid panel, I don;t have access, will send note to Dr. Paulino RilyWolters because patient should be on a cholesterol medication for goal ldl < 70.  ? ?Mediate EVERY stroke risk factor. I had a long d/w patient about her recent event, risk for stroke/TIAs, personally independently reviewed imaging studies and stroke evaluation results and answered questions.Continue ASA 81 daily (do not miss), Strict control of hypertension with blood pressure goal below 130/90, diabetes with hemoglobin A1c goal below 6.5% and lipids with LDL cholesterol goal below 70 mg/dL,  exercise regularly and maintain ideal body weight. ?Need a stroke workup which includes CTA of the blood vessels of the head/neck. Echocardiogram with bubble study. One month holter montor to evaluate for arrhythmias. ?No signs or symptoms of OSA, wakes up refreshed, no excessive daytime somnolence.  ? ? ?There is increased risk for stroke in women with migraine with aura and a contraindication for the combined contraceptive pill for use by women who have migraine with aura. The risk for women with migraine without aura is lower. However other risk factors like smoking are far more likely to increase stroke risk than migraine. There is a recommendation for no smoking and for the use of OCPs without estrogen such as progestogen only pills particularly for women with migraine with aura.Marland Kitchen. People who have migraine headaches with auras may be 3 times more likely to have a stroke caused by a blood clot, compared to migraine patients who don't see auras. Women who take hormone-replacement therapy may be 30 percent more likely to suffer a clot-based stroke than women not taking medication containing estrogen. Other risk factors like  smoking and high blood pressure may be  much more important. ? ?Stroke Prevention ?Some medical conditions and behaviors can lead to a higher chance of having a stroke. You can help prevent a stroke by eating healthy, exercising, not smoking, and managing any medical conditions you have. ?Stroke is a leading cause of functional impairment. Primary prevention is particularly important because a majority of strokes are first-time events. Stroke changes the lives of not only those who experience a stroke but also their family and other caregivers. ?How can this condition affect me? ?A stroke is a medical emergency and should be treated right away. A stroke can lead to brain damage and can sometimes be life-threatening. If a person gets medical treatment right away, there is a better chance of surviving and recovering from a stroke. ?What can increase my risk? ?The following medical conditions may increase your risk of a stroke: ?Cardiovascular disease. ?High blood pressure (hypertension). ?Diabetes. ?High cholesterol. ?Sickle cell disease. ?Blood clotting disorders (hypercoagulable state). ?Obesity. ?Sleep disorders (obstructive sleep apnea). ?Other risk factors include: ?Being older than age 63. ?Having a history of blood clots, stroke, or mini-stroke (transient ischemic attack, TIA). ?Genetic factors, such as race, ethnicity, or a family history of stroke. ?Smoking cigarettes or using other tobacco products. ?Taking birth control pills, especially if you also use tobacco. ?Heavy use of alcohol or drugs, especially cocaine and methamphetamine. ?Physical inactivity. ?What actions can I take to prevent this? ?Manage your health conditions ?High cholesterol levels. ?Eating a healthy diet is important for preventing high cholesterol. If cholesterol cannot be managed through diet alone, you may need to take medicines. ?  Take any prescribed medicines to control your cholesterol as told by your health care  provider. ?Hypertension. ?To reduce your risk of stroke, try to keep your blood pressure below 130/80. ?Eating a healthy diet and exercising regularly are important for controlling blood pressure. If these steps are not enough to manage your blood pressure, you may need to take medicines. ?Take any prescribed medicines to control hypertension as told by your health care provider. ?Ask your health care provider if you should monitor your blood pressure at home. ?Have your blood pressure checked every year, even if your blood pressure is normal. Blood pressure increases with age and some medical conditions. ?Diabetes. ?Eating a healthy diet and exercising regularly are important parts of managing your blood sugar (glucose). If your blood sugar cannot be managed through diet and exercise, you may need to take medicines. ?Take any prescribed medicines to control your diabetes as told by your health care provider. ?Get evaluated for obstructive sleep apnea. Talk to your health care provider about getting a sleep evaluation if you snore a lot or have excessive sleepiness. ?Make sure that any other medical conditions you have, such as atrial fibrillation or atherosclerosis, are managed. ?Nutrition ?Follow instructions from your health care provider about what to eat or drink to help manage your health condition. These instructions may include: ?Reducing your daily calorie intake. ?Limiting how much salt (sodium) you use to 1,500 milligrams (mg) each day. ?Using only healthy fats for cooking, such as olive oil, canola oil, or sunflower oil. ?Eating healthy foods. You can do this by: ?Choosing foods that are high in fiber, such as whole grains, and fresh fruits and vegetables. ?Eating at least 5 servings of fruits and vegetables a day. Try to fill one-half of your plate with fruits and vegetables at each meal. ?Choosing lean protein foods, such as lean cuts of meat, poultry without skin, fish, tofu, beans, and nuts. ?Eating  low-fat dairy products. ?Avoiding foods that are high in sodium. This can help lower blood pressure. ?Avoiding foods that have saturated fat, trans fat, and cholesterol. This can help prevent high cholesterol. ?Avoiding processed and prepared foods. ?Counting your daily carbohydrate intake. ? ?Lifestyle ?If you drink alcohol: ?Limit how much you have to: ?0-1 drink a day for women who are not pregnant. ?0-2 drinks a day for men. ?Know how much alcohol is in your drink. In the [place] one drink equals one 12 oz bottle of beer ( ), one 5 oz glass of wine ( ), or one 1? oz glass of hard liquor (36mL). ?Do not use any products that contain nicotine or tobacco. These products include cigarettes, chewing tobacco, and vaping devices, such as e-cigarettes. If you need help quitting, ask your health care provider. ?Avoid secondhand smoke. ?Do not use drugs. ?Activity ? ?Try to stay at a healthy weight. ?Get at least 30 minutes of exercise on most days, such as: ?Fast walking. ?Biking. ?Swimming. ?Medicines ?Take over-the-counter and prescription medicines only as told by your health care provider. Aspirin or blood thinners (antiplatelets or anticoagulants) may be recommended to reduce your risk of forming blood clots that can lead to stroke. ?Avoid taking birth control pills. Talk to your health care provider about the risks of taking birth control pills if: ?You are over 29 years old. ?You smoke. ?You get very bad headaches. ?You have had a blood clot. ?Where to find more information ?American Stroke Association: www.strokeassociation.org ?Get help right away if: ?You or a loved one has any symptoms of  a stroke. "BE FAST" is an easy way to remember the main warning signs of a stroke: ?B - Balance. Signs are dizziness, sudden trouble walking, or loss of balance. ?E - Eyes. Signs are trouble seeing or a sudden change in vision. ?F - Face. Signs are sudden weakness or numbness of the face, or the face or eyelid drooping  on one side. ?A - Arms. Signs are weakness or numbness in an arm. This happens suddenly and usually on one side of the body. ?S - Speech. Signs are sudden trouble speaking, slurred speech, or trouble understanding what pe

## 2021-08-21 NOTE — Progress Notes (Signed)
?[facility] ? ? ? ?Provider:  Dr Lucia Gaskins ?Requesting Provider: Dietrich Pates, PA-C ?Primary Care Provider:  Mila Palmer, MD ? ?CC:  migraine vs stroke ? ?HPI:  Erica Turner is a 61 y.o. female here as requested by Dietrich Pates, PA-C for migraines after ED visit 07/30/2021. PMHx diabetes, remote hx of migraines with age, HTN. Years ago she had migraines, she has an extensive family history of migraines. They stpped for years and all of a sudden she was getting headaches again, certain smells can trigger. And phonophobia. In the past she would get zig zags, tunnel vision, throbbing/pulsating, nausea. But now she is getting headaches again. She had arches to the left, in both eyes, was moving, her right hand was weak and her right leg was weak but never had the weakness before, she is very active she walks 15000+ steps a day, she feel more confused and off balance since the headaches started. Weakness is on the right. 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 Even now she has decreased hearing on the left. Worried about symptoms, did not have head pain in the ED, MRI was negative but here with daughter who also provides much information and both are concerned with TIA. No other focal neurologic deficits, associated symptoms, inciting events or modifiable factors. ? ?Reviewed notes, labs and imaging from outside physicians, which showed: ? ?MRI brain 07/30/2021: Brain: No acute infarction, hemorrhage, hydrocephalus, extra-axial ?collection or mass lesion. Dilated perivascular space in the left ?basal ganglia. ?  ?Vascular: Major arterial flow voids are maintained at the skull ?base. ?  ?Skull and upper cervical spine: Normal marrow signal. ?  ?Sinuses/Orbits: Mild paranasal sinus mucosal thickening. No acute ?orbital findings. ?  ?Other: No sizable mastoid effusions. ?  ?IMPRESSION: ?No evidence of acute intracranial  abnormality. ? ?Medications tried: From a thorough review of records, patient has tried Norvasc, aspirin, Flexeril, Advil, lisinopril, Reglan, naproxen, oxycodone and valsartan in the past which are medications that can be used in migraine and headache management. ? ?CMP showed elevated glucose 125 in the ED 07/30/2021 ?HgbA1c was around 9 per patient 3 months ago, just had another one ? ?Review of Systems: ?Patient complains of symptoms per HPI as well as the following symptoms aura. Pertinent negatives and positives per HPI. All others negative. ? ? ?Social History  ? ?Socioeconomic History  ? Marital status: Married  ?  Spouse name: Not on file  ? Number of children: Not on file  ? Years of education: Not on file  ? Highest education level: Not on file  ?Occupational History  ? Not on file  ?Tobacco Use  ? Smoking status: Never  ? Smokeless tobacco: Never  ?Vaping Use  ? Vaping Use: Never used  ?Substance and Sexual Activity  ? Alcohol use: Never  ? Drug use: No  ? Sexual activity: Not on file  ?Other Topics Concern  ? Not on file  ?Social History Narrative  ? Caffeine daily 1 cup in am 2 sodas, education:  HS grad.  Works at [employer]  ([employer]  ? ?Social Determinants of Health  ? ?Financial Resource Strain: Not on file  ?Food Insecurity: Not on file  ?Transportation Needs: Not on file  ?Physical Activity: Not on file  ?Stress: Not on file  ?Social Connections: Not on file  ?Intimate Partner Violence: Not on file  ? ? ?Family History  ?Problem  Relation Age of Onset  ? Hypertension Mother   ? Diabetes Mother   ? Asthma Mother   ? Heart failure Father   ? ? ?Past Medical History:  ?Diagnosis Date  ? Arthritis   ? neck and hip  ? Diabetes mellitus without complication (HCC)   ? takes  cinnamion and eat lots of proteins   ? Headache(784.0)   ? migraines  ? Hypertension   ? on meds  ? Sinus drainage   ? ? ?Patient Active Problem List  ? Diagnosis Date Noted  ? Radiculopathy 09/30/2013  ? ? ?Past Surgical  History:  ?Procedure Laterality Date  ? ANTERIOR CERVICAL DECOMP/DISCECTOMY FUSION N/A 09/30/2013  ? Procedure: ANTERIOR CERVICAL DECOMPRESSION/DISCECTOMY FUSION 1 LEVEL;  Surgeon: Emilee Hero, MD;  Location: [facility] OR;  Service: Orthopedics;  Laterality: N/A;  Anterior cervical decompression fusion, cervical 6-7 with instrumentation and allograft  ? BREAST SURGERY Right   ? lumpectomy  ? CARPAL TUNNEL RELEASE Bilateral   ? FOOT SURGERY Right   ? TONSILLECTOMY    ? TUBAL LIGATION    ? WISDOM TOOTH EXTRACTION    ? ? ?Current Outpatient Medications  ?Medication Sig Dispense Refill  ? acetaminophen (TYLENOL) 500 MG tablet Take 1,000 mg by mouth every 8 (eight) hours as needed (for pain).     ? ALPRAZolam (XANAX) 0.25 MG tablet alprazolam 0.25 mg tablet ? TAKE 1 TABLET BY MOUTH TWICE DAILY AS NEEDED    ? amLODipine (NORVASC) 10 MG tablet Take 10 mg by mouth daily.    ? aspirin 81 MG EC tablet 81 mg daily.    ? atorvastatin (LIPITOR) 40 MG tablet Take 40 mg by mouth daily.    ? fluticasone (FLONASE) 50 MCG/ACT nasal spray Place 1 spray into both nostrils daily as needed for allergies or rhinitis.    ? ibuprofen (ADVIL,MOTRIN) 200 MG tablet Take 400 mg by mouth every 6 (six) hours as needed for fever, headache, mild pain, moderate pain or cramping.    ? insulin degludec (TRESIBA FLEXTOUCH) 100 UNIT/ML FlexTouch Pen     ? metoCLOPramide (REGLAN) 10 MG tablet Take 1 tablet (10 mg total) by mouth every 6 (six) hours as needed for nausea (nausea/headache). 10 tablet 0  ? Semaglutide (OZEMPIC, 1 MG/DOSE, [place]) 1 mg daily.    ? ?No current facility-administered medications for this visit.  ? ? ?Allergies as of 08/21/2021 - Review Complete 08/21/2021  ?Allergen Reaction Noted  ? Lisinopril Anaphylaxis 08/25/2016  ? Pneumococcal vaccines Hives and Swelling 09/12/2016  ? Darvon [propoxyphene] Rash 08/25/2016  ? ? ?Vitals: ?BP (!) 193/98   Pulse 64   Ht 5' 3.75" (1.619 m)   Wt 151 lb 12.8 oz (68.9 kg)   BMI 26.26 kg/m?   ?Last Weight:  ?Wt Readings from Last 1 Encounters:  ?08/21/21 151 lb 12.8 oz (68.9 kg)  ? ?Last Height:   ?Ht Readings from Last 1 Encounters:  ?08/21/21 5' 3.75" (1.619 m)  ? ? ? ?Physical exam: ?Exam: ?Gen: NAD, conversant, well nourised, well groomed                     ?CV: RRR, no MRG. No Carotid Bruits. No peripheral edema, warm, nontender ?Eyes: Conjunctivae clear without exudates or hemorrhage ? ?Neuro: ?Detailed Neurologic Exam ? ?Speech: ?   Speech is normal; fluent and spontaneous with normal comprehension.  ?Cognition: ?   The patient is oriented to person, place, and time;  ?   recent and  remote memory intact;  ?   language fluent;  ?   normal attention, concentration,  ?   fund of knowledge ?Cranial Nerves: ?   The pupils are equal, round, and reactive to light. The fundi are flat. Visual fields are full to finger confrontation. Extraocular movements are intact. Trigeminal sensation is intact and the muscles of mastication are normal. The face is symmetric. The palate elevates in the midline. Hearing intact. Voice is normal. Shoulder shrug is normal. The tongue has normal motion without fasciculations.  ? ?Coordination: ?   Normal  ? ?Gait: ?    normal.  ? ?Motor Observation: ?   No asymmetry, no atrophy, and no involuntary movements noted. ?Tone: ?   Normal muscle tone.   ? ?Posture: ?   Posture is normal. normal erect ?   ?Strength: ?   Strength is V/V in the upper and lower limbs.  ?    ?Sensation: intact to LT ?    ?Reflex Exam: ? ?DTR's: ?   Deep tendon reflexes in the upper and lower extremities are symmetrical  bilaterally.   ?Toes: ?   The toes are downgoing bilaterally.   ?Clonus: ?   Clonus is absent. ?  ? ?Assessment/Plan:  Patient with migraine with aura vs TIA. Symptoms unusual for patient, right-sided weakness (never had that with a migraine in the past) and has multiple risk factors for stroke; needs a stroke eval. Prior to event 07/30/2021 was not taking ASA regularly. MRI showed no  acute events. Migraine with aura vs TIA: ? ?Having unusual symptoms, has risk factors for stroke including: diabetes, remote(and recent) hx of migraines with aura, HTN and age (HgbA1c was around 9 per patient 3 months ago, just had anot

## 2021-08-22 ENCOUNTER — Other Ambulatory Visit: Payer: Self-pay | Admitting: Neurology

## 2021-08-22 ENCOUNTER — Telehealth: Payer: Self-pay | Admitting: Neurology

## 2021-08-22 DIAGNOSIS — I4891 Unspecified atrial fibrillation: Secondary | ICD-10-CM

## 2021-08-22 DIAGNOSIS — G459 Transient cerebral ischemic attack, unspecified: Secondary | ICD-10-CM

## 2021-08-22 DIAGNOSIS — G43109 Migraine with aura, not intractable, without status migrainosus: Secondary | ICD-10-CM

## 2021-08-22 DIAGNOSIS — R531 Weakness: Secondary | ICD-10-CM

## 2021-08-22 NOTE — Telephone Encounter (Signed)
[employer] order sent to [facility], they will obtain the auth and reach out to the patient to schedule.  ?

## 2021-08-22 NOTE — Telephone Encounter (Signed)
[employer] no [facility] ref # [id number], sent message to Lupita Leash she will reach out to the patient to schedule.  ?

## 2021-09-05 ENCOUNTER — Ambulatory Visit: Payer: Managed Care, Other (non HMO) | Admitting: Neurology

## 2021-09-07 ENCOUNTER — Ambulatory Visit (INDEPENDENT_AMBULATORY_CARE_PROVIDER_SITE_OTHER): Payer: Managed Care, Other (non HMO)

## 2021-09-07 DIAGNOSIS — G459 Transient cerebral ischemic attack, unspecified: Secondary | ICD-10-CM

## 2021-09-07 DIAGNOSIS — R531 Weakness: Secondary | ICD-10-CM

## 2021-09-07 DIAGNOSIS — I4891 Unspecified atrial fibrillation: Secondary | ICD-10-CM | POA: Diagnosis not present

## 2021-09-07 DIAGNOSIS — G43109 Migraine with aura, not intractable, without status migrainosus: Secondary | ICD-10-CM

## 2021-09-18 ENCOUNTER — Ambulatory Visit (HOSPITAL_COMMUNITY): Payer: Managed Care, Other (non HMO) | Attending: Internal Medicine

## 2021-09-18 DIAGNOSIS — G43109 Migraine with aura, not intractable, without status migrainosus: Secondary | ICD-10-CM

## 2021-09-18 DIAGNOSIS — G459 Transient cerebral ischemic attack, unspecified: Secondary | ICD-10-CM | POA: Insufficient documentation

## 2021-09-18 DIAGNOSIS — R531 Weakness: Secondary | ICD-10-CM | POA: Diagnosis not present

## 2021-09-18 LAB — ECHOCARDIOGRAM COMPLETE BUBBLE STUDY
Area-P 1/2: 2.99 cm2
P 1/2 time: 600 msec
S' Lateral: 2 cm

## 2021-09-18 MED ORDER — SODIUM CHLORIDE 0.9% FLUSH
10.0000 mL | INTRAVENOUS | Status: AC | PRN
  Administered 2021-09-18: 10 mL via INTRAVENOUS

## 2021-11-11 ENCOUNTER — Encounter: Payer: Self-pay | Admitting: Adult Health

## 2021-12-26 ENCOUNTER — Encounter: Payer: Self-pay | Admitting: Neurology

## 2021-12-31 ENCOUNTER — Encounter: Payer: Self-pay | Admitting: Neurology

## 2022-01-30 ENCOUNTER — Ambulatory Visit: Payer: Managed Care, Other (non HMO) | Admitting: Neurology

## 2022-01-30 ENCOUNTER — Encounter: Payer: Self-pay | Admitting: Neurology

## 2022-01-30 ENCOUNTER — Telehealth: Payer: Self-pay | Admitting: Neurology

## 2022-01-30 VITALS — BP 141/87 | HR 66 | Ht 63.0 in | Wt 142.0 lb

## 2022-01-30 DIAGNOSIS — G629 Polyneuropathy, unspecified: Secondary | ICD-10-CM

## 2022-01-30 DIAGNOSIS — G379 Demyelinating disease of central nervous system, unspecified: Secondary | ICD-10-CM

## 2022-01-30 DIAGNOSIS — E538 Deficiency of other specified B group vitamins: Secondary | ICD-10-CM

## 2022-01-30 DIAGNOSIS — E531 Pyridoxine deficiency: Secondary | ICD-10-CM | POA: Diagnosis not present

## 2022-01-30 DIAGNOSIS — E519 Thiamine deficiency, unspecified: Secondary | ICD-10-CM

## 2022-01-30 DIAGNOSIS — R270 Ataxia, unspecified: Secondary | ICD-10-CM

## 2022-01-30 DIAGNOSIS — R202 Paresthesia of skin: Secondary | ICD-10-CM | POA: Insufficient documentation

## 2022-01-30 DIAGNOSIS — G959 Disease of spinal cord, unspecified: Secondary | ICD-10-CM

## 2022-01-30 DIAGNOSIS — R269 Unspecified abnormalities of gait and mobility: Secondary | ICD-10-CM

## 2022-01-30 DIAGNOSIS — R6889 Other general symptoms and signs: Secondary | ICD-10-CM

## 2022-01-30 NOTE — Telephone Encounter (Signed)
[employer] sent to [facility] they obtain auth [id number] 

## 2022-01-30 NOTE — Progress Notes (Signed)
[facility]    Provider:  Dr Jaynee Eagles Requesting Provider: Jonathon Jordan, MD Primary Care Provider:  Jonathon Jordan, MD  CC:  "My skin hurts"  Follow-up January 30, 2022: I saw patient in May 2023 for TIA versus migraines.  She has a past medical history of type 2 diabetes, hypertension, depression, hypercholesterolemia, COVID in 02/01/2022, and in April 2023 he was in the ER for lightheadedness headache vision loss numbness on the face and we evaluated her for migraines versus TIA; today she is here for new chief complaint, paresthesias in the trunk, I reviewed Dr. Chriss Czar notes: Started on gabapentin which helped her personality, she tolerated 300 mg well but the burning sensation persists like a severe sunburn, scalp also hurts to touch, her arms and starting in her buttocks, no motor symptoms, taking gabapentin, amlodipine, Ozempic, B12 was checked and so was iron serum as well as hemoglobin A1c CMP and a lipid panel, LDL was 78, hemoglobin A1c was 7 which was high, BUN 17 creatinine 0.55, B12 was 302 which is on the low side.  We had a follow up November 11 but she is here earlier. She says "MY skin hurts". She is here with daughter who also provides much information. No rash, no lesions, started acutely, then slowly progressed, started in the back from shoulders to the buttocks, primary in her trunk and in the front a little but mostly in the nack and tender under her bra line in a radiating fashion to the back of the thoracic spine on both sides like a circle. She has a hx of neck surgery. She has sensory level right below the neck, not in the legs more in the thorax, burning, tingling, like a severe sunburn and touching skin hurts, she couldn't stand her clothes on her body. Sensory level from the neck/shoulders down. Happened acutely. Continues to progress. She has had a rash years again the face and esophagus may consider rheumatology (talk to Dr. Stephanie Acre). No weakness.  Headaches and migraines are doing well. May have started with ozempic recommend trying to stop ozempic and see if that helps. Also reports tripping and near falls, difficulty with gait.   Patient complains of symptoms per HPI as well as the following symptoms: paresthesias . Pertinent negatives and positives per HPI. All others negative   HPI 08/21/2021:  Erica Turner here as requested by Jonathon Jordan, MD for migraines after ED visit 07/30/2021. PMHx diabetes, remote hx of migraines with age, HTN. Years ago she had migraines, she has an extensive family history of migraines. They stpped for years and all of a sudden she was getting headaches again, certain smells can trigger. And phonophobia. In the past she would get zig zags, tunnel vision, throbbing/pulsating, nausea. But now she is getting headaches again. She had arches to the left, in both eyes, was moving, her right hand was weak and her right leg was weak but never had the weakness before, she is very active she walks 15000+ steps a day, she feel more confused and off balance since the headaches started. Weakness is on the right. 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 Even now she has decreased hearing on the left. Worried about symptoms, did not have head pain in the ED, MRI was negative but here with daughter who also provides much information and both are concerned with TIA. No other  focal neurologic deficits, associated symptoms, inciting events or modifiable factors.  Reviewed notes, labs and imaging from outside physicians, which showed:  MRI brain 07/30/2021: Brain: No acute infarction, hemorrhage, hydrocephalus, extra-axial collection or mass lesion. Dilated perivascular space in the left basal ganglia.   Vascular: Major arterial flow voids are maintained at the skull base.   Skull and upper cervical spine: Normal marrow signal.   Sinuses/Orbits:  Mild paranasal sinus mucosal thickening. No acute orbital findings.   Other: No sizable mastoid effusions.   IMPRESSION: No evidence of acute intracranial abnormality.  Medications tried: From a thorough review of records, patient has tried Norvasc, aspirin, Flexeril, Advil, lisinopril, Reglan, naproxen, oxycodone and valsartan in the past which are medications that can be used in migraine and headache management.  CMP showed elevated glucose 125 in the ED 07/30/2021 HgbA1c was around 9 per patient 3 months ago, just had another one  Review of Systems: Patient complains of symptoms per HPI as well as the following symptoms aura. Pertinent negatives and positives per HPI. All others negative.   Social History   Socioeconomic History   Marital status: Married    Spouse name: Not on file   Number of children: Not on file   Years of education: Not on file   Highest education level: Not on file  Occupational History   Not on file  Tobacco Use   Smoking status: Never   Smokeless tobacco: Never  Vaping Use   Vaping Use: Never used  Substance and Sexual Activity   Alcohol use: Never   Drug use: No   Sexual activity: Not on file  Other Topics Concern   Not on file  Social History Narrative   Caffeine daily 1 cup in am 1 soda, education:  HS grad.  Works at [employer]  ([employer]   Social Determinants of Health   Financial Resource Strain: Not on file  Food Insecurity: Not on file  Transportation Needs: Not on file  Physical Activity: Not on file  Stress: Not on file  Social Connections: Not on file  Intimate Partner Violence: Not on file    Family History  Problem Relation Age of Onset   Hypertension Mother    Diabetes Mother    Asthma Mother    Heart failure Father     Past Medical History:  Diagnosis Date   Arthritis    neck and hip   COVID 01/2021   Depression    Diabetes mellitus without complication ([place])    takes  cinnamion and eat lots of proteins     Headache(784.0)    migraines   Hypercholesterolemia    Hypertension    on meds   Sinus drainage     Patient Active Problem List   Diagnosis Date Noted   Paresthesias 01/30/2022   Radiculopathy 09/30/2013    Past Surgical History:  Procedure Laterality Date   ANTERIOR CERVICAL DECOMP/DISCECTOMY FUSION N/A 09/30/2013   Procedure: ANTERIOR CERVICAL DECOMPRESSION/DISCECTOMY FUSION 1 LEVEL;  Surgeon: Sinclair Ship, MD;  Location: [place];  Service: Orthopedics;  Laterality: N/A;  Anterior cervical decompression fusion, cervical 6-7 with instrumentation and allograft   BREAST SURGERY Right    lumpectomy   CARPAL TUNNEL RELEASE Bilateral    FOOT SURGERY Right    TONSILLECTOMY     TUBAL LIGATION     WISDOM TOOTH EXTRACTION      Current Outpatient Medications  Medication Sig Dispense Refill   acetaminophen (TYLENOL) 500 MG tablet Take 1,000 mg  by mouth every 8 (eight) hours as needed (for pain).      ALPRAZolam (XANAX) 0.25 MG tablet Take 1 tablet by mouth 2 (two) times daily as needed.     amLODipine (NORVASC) 10 MG tablet Take 10 mg by mouth daily.     aspirin 81 MG EC tablet 81 mg daily.     atorvastatin (LIPITOR) 40 MG tablet Take 40 mg by mouth daily.     cetirizine (ZYRTEC) 10 MG tablet Take 10 mg by mouth daily as needed for allergies.     fluticasone (FLONASE) 50 MCG/ACT nasal spray Place 1 spray into both nostrils daily as needed for allergies or rhinitis.     gabapentin (NEURONTIN) 300 MG capsule Take 300 mg by mouth in the morning and at bedtime.     ibuprofen (ADVIL,MOTRIN) 200 MG tablet Take 400 mg by mouth every 6 (six) hours as needed for fever, headache, mild pain, moderate pain or cramping.     OZEMPIC, 2 MG/DOSE, 8 MG/3ML SOPN Inject 2 mg into the skin once a week.     No current facility-administered medications for this visit.   Facility-Administered Medications Ordered in Other Visits  Medication Dose Route Frequency Provider Last Rate Last Admin    sodium chloride flush (NS) 0.9 % injection 10 mL  10 mL Intravenous PRN Melvenia Beam, MD   10 mL at 09/18/21 1155    Allergies as of 01/30/2022 - Review Complete 01/30/2022  Allergen Reaction Noted   Lisinopril Anaphylaxis 08/25/2016   Pneumococcal vaccines Hives and Swelling 09/12/2016   Darvon [propoxyphene] Rash 08/25/2016    Vitals: BP (!) 141/87 (BP Location: Right Arm, Patient Position: Sitting)   Pulse 66   Ht $R'5\' 3"'Vq$  (1.6 m)   Wt 142 lb (64.4 kg)   BMI 25.15 kg/m  Last Weight:  Wt Readings from Last 1 Encounters:  01/30/22 142 lb (64.4 kg)   Last Height:   Ht Readings from Last 1 Encounters:  01/30/22 $RemoveB'5\' 3"'BUgsMicT$  (1.6 m)     Exam: NAD, pleasant                  Speech:    Speech is normal; fluent and spontaneous with normal comprehension.  Cognition:    The patient is oriented to person, place, and time;     recent and remote memory intact;     language fluent;    Cranial Nerves:    The pupils are equal, round, and reactive to light.Trigeminal sensation is intact and the muscles of mastication are normal. The face is symmetric. The palate elevates in the midline. Hearing intact. Voice is normal. Shoulder shrug is normal. The tongue has normal motion without fasciculations.   Coordination:  No dysmetria  Motor Observation:    No asymmetry, no atrophy, and no involuntary movements noted. Tone:    Normal muscle tone.     Strength:    Strength is V/V in the upper and lower limbs.      Sensation: Hyperesthesthesias/dysesthesias at sensory level about T3  Gait appears normal    Assessment and plan 01/30/2022: Patient here for new symptom, burning, paresthesias, acute onset, exam shows Hyperesthesthesias/dysesthesias at sensory level about T3 need imaging of the neck and thoracic spine to look for spinal cord lesions. Would';t make sense for shingles (multiple nerve dustributions), less likely polyneuropathy however in some cases we have seen this in diabetes and  B12 deficiency but the clear sensory level is very concerning for lesion of the  spine or demyelination or multiple sclerosis need MRI cervical spine and Thoracic spine and will order several labs for other causes of polyneuropathy. Also stop ozempic, may be correlated, for a few weeks to see if this is an unusual side effects.  Blood work as above MRI cervical and thoracic as above Stop ozempic for a few weeks (check in with Dr. Stephanie Acre first for diabetic management) to ensure this isn;t an unusual side effect She has had facial rashes, can check ANA panel and f/u with Dr. Stephanie Acre for rheum eval as Dr. Stephanie Acre sees clinically fit or appropriate based on her judgement If continues recommend test emg/ncs Change November appointment to December can be with an NP [place] This Encounter  Procedures   MR CERVICAL SPINE W WO CONTRAST   MR THORACIC SPINE W WO CONTRAST   B12 and Folate Panel   Methylmalonic acid, serum   Vitamin B1   Vitamin B6   CBC with Differential/Platelets   Comprehensive metabolic panel   TSH Rfx on Abnormal to Free T4   ANA, IFA (with reflex)   Multiple Myeloma Panel (SPEP&IFE w/QIG)   PRIOR Assessment/Plan 08/2021:  Patient with migraine with aura vs TIA. Symptoms unusual for patient, right-sided weakness (never had that with a migraine in the past) and has multiple risk factors for stroke; needs a stroke eval. Prior to event 07/30/2021 was not taking ASA regularly. MRI showed no acute events. Migraine with aura vs TIA:  Having unusual symptoms, has risk factors for stroke including: diabetes, remote(and recent) hx of migraines with aura, HTN and age (HgbA1c was around 9 per patient 3 months ago, just had another one repeated with Dr. Stephanie Acre), HLD? (She just had a lipid panel, I don;t have access, will send note to Dr. Stephanie Acre because patient should be on a cholesterol medication for goal ldl < 70.). Discussed in detail with daughter and patient.  Mediate  EVERY stroke risk factor. I had a long d/w patient about her recent event, risk for stroke/TIAs, personally independently reviewed imaging studies and stroke evaluation results and answered questions.Continue ASA 81 daily (do not miss), Strict control of hypertension with blood pressure goal below 130/90, diabetes with hemoglobin A1c goal below 6.5% and lipids with LDL cholesterol goal below 70 mg/dL,  exercise regularly and maintain ideal body weight. Need a completed stroke workup which includes CTA of the blood vessels of the head/neck. Echocardiogram with bubble study. One month holter montor to evaluate for arrhythmias. No signs or symptoms of OSA, wakes up refreshed, no excessive daytime somnolence.  4. If has new symptoms, call us for repeat MRI brain 5. Can follow up in 6 months and if stable then return to primary care   Cc: Jonathon Jordan, MD,  Jonathon Jordan, MD  Sarina Ill, MD  [facility] [address] [place], [place] [address]  Phone [phone] Fax [phone]  I spent over 45 minutes of face-to-face and non-face-to-face time with patient on the  1. Paresthesias   2. screen for B12 deficiency   3. screen for Vitamin B1 deficiency   4. screen for Vitamin B6 deficiency   5. Polyneuropathy   6. screen for Spinal cord lesion ([place])   7. screen for Demyelinating disease of central nervous system ([place])   8. Abnormal truncal finding   9. patient reported Ataxia   10. patient reported Gait abnormality     diagnosis.  This included previsit chart review, lab review, study review,  order entry, electronic health record documentation, patient education on the different diagnostic and therapeutic options, counseling and coordination of care, risks and benefits of management, compliance, or risk factor reduction

## 2022-01-30 NOTE — Patient Instructions (Signed)
Bloodwork MRI cervical and thoracic Stop ozemic - talk to dr Stephanie Acre, may be an odd side effects If continues recommend test emg/ncs  Electromyoneurogram Electromyoneurogram is a test to check how well your muscles and nerves are working. This procedure includes the combined use of electromyogram (EMG) and nerve conduction study (NCS). EMG is used to evaluate muscles and the nerves that control those muscles. NCS, which is also called electroneurogram, measures how well your nerves conduct electricity. The procedures should be done together to check if your muscles and nerves are healthy. If the results of the tests are abnormal, this may indicate disease or injury, such as a neuromuscular disease or peripheral nerve damage. Tell a health care provider about: Any allergies you have. All medicines you are taking, including vitamins, herbs, eye drops, creams, and over-the-counter medicines. Any bleeding problems you have. Any surgeries you have had. Any medical conditions you have. What are the risks? Generally, this is a safe procedure. However, problems may occur, including: Bleeding or bruising. Infection where the electrodes were inserted. What happens before the test? Medicines Take all of your usually prescribed medications before this testing is performed. Do not stop your blood thinners unless advised by your prescribing physician. General instructions Your health care provider may ask you to warm the limb that will be checked with warm water, hot pack, or wrapping the limb in a blanket. Do not use lotions or creams on the same day that you will be having the procedure. What happens during the test? For EMG  Your health care provider will ask you to stay in a position so that the muscle being studied can be accessed. You will be sitting or lying down. You may be given a medicine to numb the area (local anesthetic) and the skin will be disinfected. A very thin needle that has an  electrode will be inserted into your muscle, one muscle at a time. Typically, multiple muscles are evaluated during a single study. Another small electrode will be placed on your skin near the muscle. Your health care provider will ask you to continue to remain still. The electrodes will record the electrical activity of your muscles. You may see this on a monitor or hear it in the room. After your muscles have been studied at rest, your health care provider will ask you to contract or flex your muscles. The electrodes will record the electrical activity of your muscles. Your health care provider will remove the electrodes and the electrode needle when the procedure is finished. The procedure may vary among health care providers and hospitals. For NCS  An electrode that records your nerve activity (recording electrode) will be placed on your skin by the muscle that is being studied. An electrode that is used as a reference (reference electrode) will be placed near the recording electrode. A paste or gel will be applied to your skin between the recording electrode and the reference electrode. Your nerve will be stimulated with a mild shock. The speed of the nerves and strength of response is recorded by the electrodes. Your health care provider will remove the electrodes and the gel when the procedure is finished. The procedure may vary among health care providers and hospitals. What can I expect after the test? It is up to you to get your test results. Ask your health care provider, or the department that is doing the test, when your results will be ready. Your health care provider may: Give you medicines for any pain.  Monitor the insertion sites to make sure that bleeding stops. You should be able to drive yourself to and from the test. Discomfort can persist for a few hours after the test, but should be better the next day. Contact a health care provider if: You have swelling, redness, or  drainage at any of the insertion sites. Summary Electromyoneurogram is a test to check how well your muscles and nerves are working. If the results of the tests are abnormal, this may indicate disease or injury. This is a safe procedure. However, problems may occur, such as bleeding and infection. Your health care provider will do two tests to complete this procedure. One checks your muscles (EMG) and another checks your nerves (NCS). It is up to you to get your test results. Ask your health care provider, or the department that is doing the test, when your results will be ready. This information is not intended to replace advice given to you by your health care provider. Make sure you discuss any questions you have with your health care provider. Document Revised: 12/13/2020 Document Reviewed: 11/12/2020 [employer] Patient Education  [employer].

## 2022-02-05 LAB — COMPREHENSIVE METABOLIC PANEL
ALT: 16 IU/L (ref 0–32)
AST: 11 IU/L (ref 0–40)
Albumin/Globulin Ratio: 1.6 (ref 1.2–2.2)
Albumin: 4.7 g/dL (ref 3.9–4.9)
Alkaline Phosphatase: 76 IU/L (ref 44–121)
BUN/Creatinine Ratio: 25 (ref 12–28)
BUN: 15 mg/dL (ref 8–27)
Bilirubin Total: 0.3 mg/dL (ref 0.0–1.2)
CO2: 23 mmol/L (ref 20–29)
Calcium: 9.5 mg/dL (ref 8.7–10.3)
Chloride: 101 mmol/L (ref 96–106)
Creatinine, Ser: 0.59 mg/dL (ref 0.57–1.00)
Globulin, Total: 2.9 g/dL (ref 1.5–4.5)
Glucose: 110 mg/dL — ABNORMAL HIGH (ref 70–99)
Potassium: 4.7 mmol/L (ref 3.5–5.2)
Sodium: 138 mmol/L (ref 134–144)
Total Protein: 7.6 g/dL (ref 6.0–8.5)
eGFR: 102 mL/min/{1.73_m2} (ref >59)

## 2022-02-05 LAB — ANTINUCLEAR ANTIBODIES, IFA: ANA Titer 1: NEGATIVE

## 2022-02-05 LAB — CBC WITH DIFFERENTIAL/PLATELET
Basophils Absolute: 0 10*3/uL (ref 0.0–0.2)
Basos: 1 %
EOS (ABSOLUTE): 0.1 10*3/uL (ref 0.0–0.4)
Eos: 1 %
Hematocrit: 42.8 % (ref 34.0–46.6)
Hemoglobin: 14.4 g/dL (ref 11.1–15.9)
Immature Grans (Abs): 0 10*3/uL (ref 0.0–0.1)
Immature Granulocytes: 0 %
Lymphocytes Absolute: 2.9 10*3/uL (ref 0.7–3.1)
Lymphs: 36 %
MCH: 29.9 pg (ref 26.6–33.0)
MCHC: 33.6 g/dL (ref 31.5–35.7)
MCV: 89 fL (ref 79–97)
Monocytes Absolute: 0.4 10*3/uL (ref 0.1–0.9)
Monocytes: 5 %
Neutrophils Absolute: 4.5 10*3/uL (ref 1.4–7.0)
Neutrophils: 57 %
Platelets: 331 10*3/uL (ref 150–450)
RBC: 4.82 x10E6/uL (ref 3.77–5.28)
RDW: 13.5 % (ref 11.7–15.4)
WBC: 7.9 10*3/uL (ref 3.4–10.8)

## 2022-02-05 LAB — MULTIPLE MYELOMA PANEL, SERUM
Albumin SerPl Elph-Mcnc: 4.4 g/dL (ref 2.9–4.4)
Albumin/Glob SerPl: 1.4 (ref 0.7–1.7)
Alpha 1: 0.2 g/dL (ref 0.0–0.4)
Alpha2 Glob SerPl Elph-Mcnc: 0.8 g/dL (ref 0.4–1.0)
B-Globulin SerPl Elph-Mcnc: 1.3 g/dL (ref 0.7–1.3)
Gamma Glob SerPl Elph-Mcnc: 1 g/dL (ref 0.4–1.8)
Globulin, Total: 3.2 g/dL (ref 2.2–3.9)
IgA/Immunoglobulin A, Serum: 314 mg/dL (ref 87–352)
IgG (Immunoglobin G), Serum: 963 mg/dL (ref 586–1602)
IgM (Immunoglobulin M), Srm: 74 mg/dL (ref 26–217)

## 2022-02-05 LAB — B12 AND FOLATE PANEL
Folate: 7.8 ng/mL (ref >3.0)
Vitamin B-12: 460 pg/mL (ref 232–1245)

## 2022-02-05 LAB — TSH RFX ON ABNORMAL TO FREE T4: TSH: 0.513 u[IU]/mL (ref 0.450–4.500)

## 2022-02-05 LAB — VITAMIN B6: Vitamin B6: 12.1 ug/L (ref 3.4–65.2)

## 2022-02-05 LAB — VITAMIN B1: Thiamine: 129 nmol/L (ref 66.5–200.0)

## 2022-02-05 LAB — METHYLMALONIC ACID, SERUM: Methylmalonic Acid: 109 nmol/L (ref 0–378)

## 2022-02-16 ENCOUNTER — Other Ambulatory Visit: Payer: Managed Care, Other (non HMO)

## 2022-02-16 ENCOUNTER — Encounter: Payer: Self-pay | Admitting: Neurology

## 2022-02-19 ENCOUNTER — Other Ambulatory Visit: Payer: Self-pay | Admitting: Neurology

## 2022-02-19 ENCOUNTER — Encounter: Payer: Self-pay | Admitting: Adult Health

## 2022-02-19 MED ORDER — ALPRAZOLAM 0.25 MG PO TABS: ORAL_TABLET | ORAL | 0 refills | Status: AC

## 2022-02-23 ENCOUNTER — Ambulatory Visit
Admission: RE | Admit: 2022-02-23 | Discharge: 2022-02-23 | Disposition: A | Discharge status: Home / Self Care | Payer: Managed Care, Other (non HMO) | Source: Ambulatory Visit | Attending: Neurology | Admitting: Neurology

## 2022-02-23 DIAGNOSIS — R270 Ataxia, unspecified: Secondary | ICD-10-CM

## 2022-02-23 DIAGNOSIS — G959 Disease of spinal cord, unspecified: Secondary | ICD-10-CM

## 2022-02-23 DIAGNOSIS — G379 Demyelinating disease of central nervous system, unspecified: Secondary | ICD-10-CM

## 2022-02-23 DIAGNOSIS — R269 Unspecified abnormalities of gait and mobility: Secondary | ICD-10-CM

## 2022-02-23 DIAGNOSIS — R6889 Other general symptoms and signs: Secondary | ICD-10-CM

## 2022-02-23 DIAGNOSIS — R202 Paresthesia of skin: Secondary | ICD-10-CM

## 2022-02-23 MED ORDER — GADOPICLENOL 0.5 MMOL/ML IV SOLN
6.0000 mL | Freq: Once | INTRAVENOUS | Status: AC | PRN
  Administered 2022-02-23: 6 mL via INTRAVENOUS

## 2022-02-25 ENCOUNTER — Telehealth: Payer: Self-pay | Admitting: *Deleted

## 2022-02-25 NOTE — Telephone Encounter (Signed)
I called pt no answer. Will try later.  MRI Thoracic spine normal.

## 2022-02-26 ENCOUNTER — Ambulatory Visit: Payer: Managed Care, Other (non HMO) | Admitting: Adult Health

## 2022-03-18 NOTE — Telephone Encounter (Signed)
I called and no answer for pt to relay her MRI results.   Pt saw in mychart as per below.    "Thoracic spine normal, great news"  dr Lucia Gaskins Written by Anson Fret, MD on 02/25/2022  3:03 PM EST Seen by patient Erica Turner on 02/28/2022 11:56 AM

## 2022-04-25 ENCOUNTER — Ambulatory Visit: Payer: Managed Care, Other (non HMO) | Admitting: Adult Health

## 2023-10-06 ENCOUNTER — Other Ambulatory Visit: Payer: Self-pay | Admitting: Orthopedic Surgery

## 2023-10-06 DIAGNOSIS — M542 Cervicalgia: Secondary | ICD-10-CM

## 2023-10-16 ENCOUNTER — Inpatient Hospital Stay: Admission: RE | Admit: 2023-10-16 | Source: Ambulatory Visit

## 2023-10-18 ENCOUNTER — Ambulatory Visit
Admission: RE | Admit: 2023-10-18 | Discharge: 2023-10-18 | Disposition: A | Discharge status: Home / Self Care | Source: Ambulatory Visit | Attending: Orthopedic Surgery | Admitting: Orthopedic Surgery

## 2023-10-18 DIAGNOSIS — M542 Cervicalgia: Secondary | ICD-10-CM

## 2024-04-20 DIAGNOSIS — Z1231 Encounter for screening mammogram for malignant neoplasm of breast: Secondary | ICD-10-CM
